# Patient Record
Sex: Female | Born: 1975
Health system: Southern US, Community
[De-identification: ages and names within clinical notes are randomized; demographics above are authoritative.]

## PROBLEM LIST (undated history)

## (undated) DIAGNOSIS — F419 Anxiety disorder, unspecified: Secondary | ICD-10-CM

## (undated) DIAGNOSIS — F410 Panic disorder [episodic paroxysmal anxiety] without agoraphobia: Secondary | ICD-10-CM

## (undated) DIAGNOSIS — E039 Hypothyroidism, unspecified: Secondary | ICD-10-CM

## (undated) DIAGNOSIS — R002 Palpitations: Secondary | ICD-10-CM

## (undated) HISTORY — DX: Anxiety disorder, unspecified: F41.9

## (undated) HISTORY — DX: Hypothyroidism, unspecified: E03.9

## (undated) HISTORY — DX: Panic disorder (episodic paroxysmal anxiety): F41.0

---
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
## 2002-08-03 HISTORY — PX: CHOLECYSTECTOMY: SHX55

## 2006-05-17 ENCOUNTER — Inpatient Hospital Stay: Payer: Self-pay | Admitting: Certified Nurse Midwife

## 2011-06-01 ENCOUNTER — Ambulatory Visit: Payer: Self-pay | Admitting: Family Medicine

## 2012-08-03 HISTORY — PX: LAPAROSCOPIC GASTRIC SLEEVE RESECTION: SHX5895

## 2013-04-26 ENCOUNTER — Ambulatory Visit: Payer: Self-pay | Admitting: Specialist

## 2013-04-26 DIAGNOSIS — E7800 Pure hypercholesterolemia, unspecified: Secondary | ICD-10-CM

## 2013-05-18 ENCOUNTER — Other Ambulatory Visit: Payer: Self-pay | Admitting: Specialist

## 2013-05-18 DIAGNOSIS — J029 Acute pharyngitis, unspecified: Secondary | ICD-10-CM

## 2013-05-26 ENCOUNTER — Ambulatory Visit
Admission: RE | Admit: 2013-05-26 | Discharge: 2013-05-26 | Disposition: A | Discharge status: Home / Self Care | Payer: 59 | Source: Ambulatory Visit | Attending: Specialist | Admitting: Specialist

## 2013-05-26 DIAGNOSIS — J029 Acute pharyngitis, unspecified: Secondary | ICD-10-CM

## 2013-06-02 ENCOUNTER — Ambulatory Visit: Payer: Self-pay | Admitting: Specialist

## 2013-06-03 ENCOUNTER — Ambulatory Visit: Payer: Self-pay | Admitting: Specialist

## 2013-07-17 ENCOUNTER — Ambulatory Visit: Payer: Self-pay | Admitting: Specialist

## 2015-04-09 ENCOUNTER — Ambulatory Visit (INDEPENDENT_AMBULATORY_CARE_PROVIDER_SITE_OTHER): Payer: 59 | Admitting: Family Medicine

## 2015-04-09 ENCOUNTER — Encounter: Payer: Self-pay | Admitting: Family Medicine

## 2015-04-09 VITALS — BP 116/70 | HR 84 | Temp 98.0°F | Resp 16 | Ht 62.0 in | Wt 155.2 lb

## 2015-04-09 DIAGNOSIS — F418 Other specified anxiety disorders: Secondary | ICD-10-CM

## 2015-04-09 DIAGNOSIS — Z1231 Encounter for screening mammogram for malignant neoplasm of breast: Secondary | ICD-10-CM | POA: Insufficient documentation

## 2015-04-09 DIAGNOSIS — R002 Palpitations: Secondary | ICD-10-CM

## 2015-04-09 DIAGNOSIS — E039 Hypothyroidism, unspecified: Secondary | ICD-10-CM | POA: Diagnosis not present

## 2015-04-09 DIAGNOSIS — F3341 Major depressive disorder, recurrent, in partial remission: Secondary | ICD-10-CM | POA: Insufficient documentation

## 2015-04-09 DIAGNOSIS — Z136 Encounter for screening for cardiovascular disorders: Secondary | ICD-10-CM

## 2015-04-09 DIAGNOSIS — IMO0001 Reserved for inherently not codable concepts without codable children: Secondary | ICD-10-CM | POA: Insufficient documentation

## 2015-04-09 DIAGNOSIS — Z13220 Encounter for screening for lipoid disorders: Secondary | ICD-10-CM

## 2015-04-09 HISTORY — DX: Hypothyroidism, unspecified: E03.9

## 2015-04-09 HISTORY — DX: Palpitations: R00.2

## 2015-04-09 MED ORDER — ALPRAZOLAM 0.5 MG PO TABS: 0.5000 mg | ORAL_TABLET | Freq: Every day | ORAL | Status: DC | PRN

## 2015-04-09 MED ORDER — SERTRALINE HCL 50 MG PO TABS: 50.0000 mg | ORAL_TABLET | Freq: Every day | ORAL | Status: DC

## 2015-04-09 NOTE — Progress Notes (Signed)
Name: Sheri Garrison   MRN: [id number]    DOB: [birth year]   Date:04/09/2015       Progress Note  Subjective  Chief Complaint  Chief Complaint  Patient presents with  . Establish Care  . Labs Only    patient needs to have her thyroid check, has already been diagnosed.  Marland Kitchen Referral    mammogram  . Anxiety    HPI  Sheri Garrison is a 39 year old female who is here today to establish care for primary care services and discuss ongoing medical needs.  She would like blood work done and an order for her annual mammogram. PAP testing done 09/03/2013.   Anxiety: Patient complains of anxiety disorder, panic attacks and sleep disturbance.  She has the following symptoms: chest pain, difficulty concentrating, fatigue, feelings of losing control, insomnia, palpitations, racing thoughts. Onset of symptoms was approximately several years ago, stable since that time. She denies current suicidal and homicidal ideation. Family history significant for anxiety, depression and heart disease. Possible organic causes contributing are: endocrine/metabolic. Risk factors: previous episode of depression. Previous treatment includes Xanax and Zoloft.  She complains of the following side effects from the treatment: none.  Palpitations: Patient complains of palpitations and rapid heart beat.  The symptoms are of off and on and intermittent severity, occuring intermittently and in the evening and lasting several years per episode. Cardiac risk factors include: family history of premature cardiovascular disease. Aggravating factors: stress/anxiety, thyroid medication. Relieving factors: spontaneous. Associated signs and symptoms: has no complaint(s) of claudication, dyspnea, exertional chest pressure/discomfort, lower extremity edema, near-syncope and syncope.  Hypothyroidism: Patient presents for evaluation of thyroid function. Symptoms consist of fatigue, anxiousness, palpitations. Symptoms have present for several years. The  symptoms are off and on and intermittent.  The problem has been stable.  Previous thyroid studies include TSH, free thyroxine index and free thyroxine. The hypothyroidism is due to hypothyroidism and Hashimoto's disease.  No family history of thyroid cancer.      Patient Active Problem List   Diagnosis Date Noted  . Hypothyroidism, adult 04/09/2015  . Depression with anxiety 04/09/2015    Social History  Substance Use Topics  . Smoking status: Never Smoker   . Smokeless tobacco: Not on file  . Alcohol Use: 0.0 oz/week    0 Standard drinks or equivalent per week     Comment: occasionally     Current outpatient prescriptions:  .  ALPRAZolam (XANAX) 0.5 MG tablet, Take 0.5 mg by mouth daily as needed., Disp: , Rfl: 0 .  levothyroxine (SYNTHROID, LEVOTHROID) 175 MCG tablet, TAKE 1 TABLET (175 MCG) BY ORAL ROUTE ONCE DAILY FOR 30 DAYS, Disp: , Rfl: 0 .  sertraline (ZOLOFT) 25 MG tablet, Take 50 mg by mouth daily., Disp: , Rfl: 3  Past Surgical History  Procedure Laterality Date  . Cholecystectomy  2004  . Laparoscopic gastric sleeve resection  2014    Family History  Problem Relation Age of Onset  . Cancer Mother     breast  . Heart disease Mother   . Hyperlipidemia Mother   . Hypertension Mother   . Hearing loss Father     due to age  . Depression Maternal Aunt   . Hyperlipidemia Maternal Aunt   . Hypertension Maternal Aunt   . Arthritis Maternal Grandmother   . Hyperlipidemia Maternal Grandmother   . Hypertension Maternal Grandmother   . Heart disease Maternal Grandfather   . Hyperlipidemia Maternal Grandfather   . Hypertension Maternal  Grandfather   . Stroke Paternal Grandmother   . Heart disease Paternal Grandfather     Allergies  Allergen Reactions  . Alka-Seltzer Plus Cold [Chlorphen-Phenyleph-Asa] Swelling  . Tessalon [Benzonatate] Hives and Swelling     Review of Systems  CONSTITUTIONAL: No significant weight changes, fever, chills, weakness or  fatigue.  HEENT:  - Eyes: No visual changes.  - Ears: No auditory changes. No pain.  - Nose: No sneezing, congestion, runny nose. - Throat: No sore throat. No changes in swallowing. SKIN: No rash or itching.  CARDIOVASCULAR: No chest pain, chest pressure or chest discomfort. No palpitations or edema.  RESPIRATORY: No shortness of breath, cough or sputum.  GASTROINTESTINAL: No anorexia, nausea, vomiting. No changes in bowel habits. No abdominal pain or blood.  GENITOURINARY: No dysuria. No frequency. No discharge.  NEUROLOGICAL: No headache, dizziness, syncope, paralysis, ataxia, numbness or tingling in the extremities. No memory changes. No change in bowel or bladder control.  MUSCULOSKELETAL: No joint pain. No muscle pain. HEMATOLOGIC: No anemia, bleeding or bruising.  LYMPHATICS: No enlarged lymph nodes.  PSYCHIATRIC: No change in mood. No change in sleep pattern.  ENDOCRINOLOGIC: No reports of sweating, cold or heat intolerance. No polyuria or polydipsia.     Objective  BP 116/70 mmHg  Pulse 84  Temp(Src) 98 F (36.7 C) (Oral)  Resp 16  Ht  (1.575 m)  Wt 155 lb 3.2 oz (70.398 kg)  BMI 28.38 kg/m2  SpO2 98%  LMP 04/02/2015 (Exact Date) Body mass index is 28.38 kg/(m^2).  Physical Exam  Constitutional: Patient appears well-developed and well-nourished. In no distress.  HEENT:  - Head: Normocephalic and atraumatic.  - Ears: Bilateral TMs gray, no erythema or effusion - Nose: Nasal mucosa moist - Mouth/Throat: Oropharynx is clear and moist. No tonsillar hypertrophy or erythema. No post nasal drainage.  - Eyes: Conjunctivae clear, EOM movements normal. PERRLA. No scleral icterus.  Neck: Normal range of motion. Neck supple. No JVD present. No thyromegaly present.  Cardiovascular: Normal rate, regular rhythm and normal heart sounds.  No murmur heard.  Pulmonary/Chest: Effort normal and breath sounds normal. No respiratory distress. Musculoskeletal: Normal range of  motion bilateral UE and LE, no joint effusions. Peripheral vascular: Bilateral LE no edema. Neurological: CN II-XII grossly intact with no focal deficits. Alert and oriented to person, place, and time. Coordination, balance, strength, speech and gait are normal.  Skin: Skin is warm and dry. No rash noted. No erythema.  Psychiatric: Patient has a stable mood and affect. Behavior is normal in office today. Judgment and thought content normal in office today.   Assessment & Plan  1. Hypothyroidism, adult Lab work. Has previously been on Synthroid brand due to fluctuations in thyroid control.  - TSH - T3, free - T4, free  2. Depression with anxiety Clinically stable findings based on clinical exam and on review of any pertinent results. Recommended to patient that they continue their current regimen with regular follow ups.  - ALPRAZolam (XANAX) 0.5 MG tablet; Take 1 tablet (0.5 mg total) by mouth daily as needed.  Dispense: 30 tablet; Refill: 5 - sertraline (ZOLOFT) 50 MG tablet; Take 1 tablet (50 mg total) by mouth daily.  Dispense: 30 tablet; Refill: 5  3. Encounter for cholesteral screening for cardiovascular disease  - Lipid panel  4. Encounter for screening mammogram for malignant neoplasm of breast  - MM Digital Screening; Future  5. Heart palpitations Most likely related to anxiety. Will get EKG during CPE.  -  CBC with Differential/Platelet - Comprehensive metabolic panel - Magnesium

## 2015-04-15 LAB — CBC WITH DIFFERENTIAL/PLATELET
Basophils Absolute: 0.1 10*3/uL (ref 0.0–0.2)
Basos: 1 %
EOS (ABSOLUTE): 0.3 10*3/uL (ref 0.0–0.4)
Eos: 5 %
Hematocrit: 33.9 % — ABNORMAL LOW (ref 34.0–46.6)
Hemoglobin: 11.2 g/dL (ref 11.1–15.9)
Immature Grans (Abs): 0 10*3/uL (ref 0.0–0.1)
Immature Granulocytes: 0 %
LYMPHS ABS: 2.2 10*3/uL (ref 0.7–3.1)
Lymphs: 39 %
MCH: 27.8 pg (ref 26.6–33.0)
MCHC: 33 g/dL (ref 31.5–35.7)
MCV: 84 fL (ref 79–97)
MONOS ABS: 0.5 10*3/uL (ref 0.1–0.9)
Monocytes: 8 %
Neutrophils Absolute: 2.6 10*3/uL (ref 1.4–7.0)
Neutrophils: 47 %
PLATELETS: 356 10*3/uL (ref 150–379)
RBC: 4.03 x10E6/uL (ref 3.77–5.28)
RDW: 14.5 % (ref 12.3–15.4)
WBC: 5.6 10*3/uL (ref 3.4–10.8)

## 2015-04-16 LAB — LIPID PANEL
Chol/HDL Ratio: 2.7 ratio units (ref 0.0–4.4)
Cholesterol, Total: 173 mg/dL (ref 100–199)
HDL: 65 mg/dL (ref >39)
LDL Calculated: 90 mg/dL (ref 0–99)
TRIGLYCERIDES: 88 mg/dL (ref 0–149)
VLDL CHOLESTEROL CAL: 18 mg/dL (ref 5–40)

## 2015-04-16 LAB — COMPREHENSIVE METABOLIC PANEL
ALBUMIN: 4.1 g/dL (ref 3.5–5.5)
ALT: 16 IU/L (ref 0–32)
AST: 17 IU/L (ref 0–40)
Albumin/Globulin Ratio: 1.6 (ref 1.1–2.5)
Alkaline Phosphatase: 59 IU/L (ref 39–117)
BUN/Creatinine Ratio: 13 (ref 9–23)
BUN: 10 mg/dL (ref 6–24)
Bilirubin Total: 0.7 mg/dL (ref 0.0–1.2)
CALCIUM: 9.6 mg/dL (ref 8.7–10.2)
CO2: 25 mmol/L (ref 18–29)
CREATININE: 0.76 mg/dL (ref 0.57–1.00)
Chloride: 99 mmol/L (ref 97–108)
GFR calc Af Amer: 113 mL/min/{1.73_m2} (ref >59)
GFR, EST NON AFRICAN AMERICAN: 98 mL/min/{1.73_m2} (ref >59)
GLOBULIN, TOTAL: 2.5 g/dL (ref 1.5–4.5)
Glucose: 81 mg/dL (ref 65–99)
Potassium: 5.1 mmol/L (ref 3.5–5.2)
SODIUM: 136 mmol/L (ref 134–144)
Total Protein: 6.6 g/dL (ref 6.0–8.5)

## 2015-04-16 LAB — T3, FREE: T3, Free: 2.7 pg/mL (ref 2.0–4.4)

## 2015-04-16 LAB — T4, FREE: Free T4: 1.68 ng/dL (ref 0.82–1.77)

## 2015-04-16 LAB — TSH: TSH: 0.039 u[IU]/mL — AB (ref 0.450–4.500)

## 2015-04-16 LAB — MAGNESIUM: MAGNESIUM: 2 mg/dL (ref 1.6–2.3)

## 2015-06-26 ENCOUNTER — Encounter: Payer: Self-pay | Admitting: Family Medicine

## 2015-07-01 ENCOUNTER — Other Ambulatory Visit: Payer: Self-pay | Admitting: Family Medicine

## 2015-07-01 MED ORDER — LEVOTHYROXINE SODIUM 175 MCG PO TABS: 175.0000 ug | ORAL_TABLET | Freq: Every day | ORAL | Status: DC

## 2015-07-17 ENCOUNTER — Other Ambulatory Visit: Payer: Self-pay

## 2015-07-17 DIAGNOSIS — F418 Other specified anxiety disorders: Secondary | ICD-10-CM

## 2015-07-17 MED ORDER — SERTRALINE HCL 50 MG PO TABS: 50.0000 mg | ORAL_TABLET | Freq: Every day | ORAL | Status: DC

## 2015-07-17 NOTE — Telephone Encounter (Signed)
Got a fax from [facility] requesting a 90 day supply of this patient's Zoloft.  Refill request was sent to Dr. Edwena FeltyAshany Sundaram for approval and submission.

## 2015-07-22 ENCOUNTER — Other Ambulatory Visit: Payer: Self-pay

## 2015-07-22 DIAGNOSIS — F418 Other specified anxiety disorders: Secondary | ICD-10-CM

## 2015-07-22 NOTE — Telephone Encounter (Signed)
Got a fax from [facility] requesting a refill of this patient's Zoloft.  Refill request was sent to Dr. Edwena FeltyAshany Sundaram for approval and submission.

## 2015-07-23 MED ORDER — SERTRALINE HCL 50 MG PO TABS: 50.0000 mg | ORAL_TABLET | Freq: Every day | ORAL | Status: DC

## 2015-07-30 ENCOUNTER — Encounter: Payer: Self-pay | Admitting: Family Medicine

## 2015-07-30 ENCOUNTER — Ambulatory Visit (INDEPENDENT_AMBULATORY_CARE_PROVIDER_SITE_OTHER): Payer: 59 | Admitting: Family Medicine

## 2015-07-30 VITALS — BP 116/72 | HR 72 | Temp 98.0°F | Resp 12 | Wt 157.8 lb

## 2015-07-30 DIAGNOSIS — L989 Disorder of the skin and subcutaneous tissue, unspecified: Secondary | ICD-10-CM | POA: Insufficient documentation

## 2015-07-30 DIAGNOSIS — F3341 Major depressive disorder, recurrent, in partial remission: Secondary | ICD-10-CM

## 2015-07-30 DIAGNOSIS — E039 Hypothyroidism, unspecified: Secondary | ICD-10-CM

## 2015-07-30 DIAGNOSIS — F418 Other specified anxiety disorders: Secondary | ICD-10-CM

## 2015-07-30 DIAGNOSIS — Z0000 Encounter for general adult medical examination without abnormal findings: Secondary | ICD-10-CM

## 2015-07-30 MED ORDER — BUPROPION HCL ER (SR) 150 MG PO TB12: 150.0000 mg | ORAL_TABLET | Freq: Two times a day (BID) | ORAL | Status: DC

## 2015-07-30 NOTE — Progress Notes (Signed)
Name: Sheri Garrison   MRN: [id number]    DOB: [birth year]   Date:07/30/2015       Progress Note  Subjective  Chief Complaint  Chief Complaint  Patient presents with  . Annual Exam    patient has a questionable mole on her the left side of her back near bra strap.  Marland Kitchen. Referral  . Anxiety    patient stated that she feels that her anxiety has increased  . Fatigue    patient has had decreased energy    HPI  Patient is here today for a Complete Female Physical Exam:  The patient has several complaints. Overall feels health needs are stable. Diet is well balanced. In general does not exercise regularly. Sees dentist regularly and addresses vision concerns with ophthalmologist if applicable. In regards to sexual activity the patient is currently sexually active. Currently is not concerned about exposure to any STDs. Does report a pruritic skin lesion left upper back near shoulder blade. Onset months ago. Has not seen Dermatology in awhile.   Anxiety: Patient complains of snappy moods, anxiety disorder, panic attacks and sleep disturbance. She has the following symptoms: chest pain, difficulty concentrating, fatigue, feelings of losing control, insomnia, palpitations, racing thoughts. Onset of symptoms was approximately several years ago, stable since that time. She denies current suicidal and homicidal ideation. Family history significant for anxiety, depression and heart disease. Possible organic causes contributing are: endocrine/metabolic. Risk factors: previous episode of depression. Previous treatment includes Xanax and Zoloft. She complains of the following side effects from the treatment: none. Has been on Wellbutrin in the past as well.   Hypothyroidism: Patient presents for evaluation of thyroid function. Symptoms consist of fatigue, anxiousness, palpitations. Symptoms have present for several years. The symptoms are off and on and intermittent. The problem has been stable. Previous  thyroid studies include TSH, free thyroxine index and free thyroxine. The hypothyroidism is due to hypothyroidism and Hashimoto's disease. No family history of thyroid cancer. We previously tried to contact patient regarding lab results in 04/2015 but she did not return our calls. Her TSH was suppressed suggesting too much levothyroxine dose but today she is reporting fatigue symptoms which may be related to mood disorder vs hypothyroidism.   Past Medical History  Diagnosis Date  . Anxiety     Past Surgical History  Procedure Laterality Date  . Cholecystectomy  2004  . Laparoscopic gastric sleeve resection  2014    Family History  Problem Relation Age of Onset  . Cancer Mother     breast  . Heart disease Mother   . Hyperlipidemia Mother   . Hypertension Mother   . Hearing loss Father     due to age  . Depression Maternal Aunt   . Hyperlipidemia Maternal Aunt   . Hypertension Maternal Aunt   . Arthritis Maternal Grandmother   . Hyperlipidemia Maternal Grandmother   . Hypertension Maternal Grandmother   . Heart disease Maternal Grandfather   . Hyperlipidemia Maternal Grandfather   . Hypertension Maternal Grandfather   . Stroke Paternal Grandmother   . Heart disease Paternal Grandfather     Social History   Social History  . Marital Status: Married    Spouse Name: N/A  . Number of Children: N/A  . Years of Education: N/A   Occupational History  . Not on file.   Social History Main Topics  . Smoking status: Never Smoker   . Smokeless tobacco: Not on file  . Alcohol Use: 0.0 oz/week  0 Standard drinks or equivalent per week     Comment: occasionally  . Drug Use: No  . Sexual Activity:    Partners: Male    Birth Control/ Protection: None   Other Topics Concern  . Not on file   Social History Narrative     Current outpatient prescriptions:  .  ALPRAZolam (XANAX) 0.5 MG tablet, Take 1 tablet (0.5 mg total) by mouth daily as needed., Disp: 30 tablet, Rfl:  5 .  levothyroxine (SYNTHROID, LEVOTHROID) 175 MCG tablet, Take 1 tablet (175 mcg total) by mouth daily before breakfast., Disp: 30 tablet, Rfl: 0 .  sertraline (ZOLOFT) 50 MG tablet, Take 1 tablet (50 mg total) by mouth daily., Disp: 90 tablet, Rfl: 1  Allergies  Allergen Reactions  . Alka-Seltzer Plus Cold [Chlorphen-Phenyleph-Asa] Swelling  . Tessalon [Benzonatate] Hives and Swelling    ROS  CONSTITUTIONAL: No significant weight changes, fever, chills, weakness. Yes fatigue.  HEENT:  - Eyes: No visual changes.  - Ears: No auditory changes. No pain.  - Nose: No sneezing, congestion, runny nose. - Throat: No sore throat. No changes in swallowing. SKIN:Yes rash and itching.  CARDIOVASCULAR: No chest pain, chest pressure or chest discomfort. No palpitations or edema.  RESPIRATORY: No shortness of breath, cough or sputum.  GASTROINTESTINAL: No anorexia, nausea, vomiting. No changes in bowel habits. No abdominal pain or blood.  GENITOURINARY: No dysuria. No frequency. No discharge.  NEUROLOGICAL: No headache, dizziness, syncope, paralysis, ataxia, numbness or tingling in the extremities. No memory changes. No change in bowel or bladder control.  MUSCULOSKELETAL: No joint pain. No muscle pain. HEMATOLOGIC: No anemia, bleeding or bruising.  LYMPHATICS: No enlarged lymph nodes.  PSYCHIATRIC: Slight change in mood. No change in sleep pattern.  ENDOCRINOLOGIC: No reports of sweating, cold or heat intolerance. No polyuria or polydipsia.   Objective  Filed Vitals:   07/30/15 1150  BP: 116/72  Pulse: 72  Temp: 98 F (36.7 C)  TempSrc: Oral  Resp: 12  Weight: 157 lb 12.8 oz (71.578 kg)  SpO2: 98%   Body mass index is 28.85 kg/(m^2).  Depression screen [facility] 2/9 07/30/2015 04/09/2015  Decreased Interest 0 0  Down, Depressed, Hopeless 0 0  PHQ - 2 Score 0 0     Physical Exam  Constitutional: Patient appears well-developed and well-nourished. In no distress.  HEENT:  - Head:  Normocephalic and atraumatic.  - Ears: Bilateral TMs gray, no erythema or effusion - Nose: Nasal mucosa moist - Mouth/Throat: Oropharynx is clear and moist. No tonsillar hypertrophy or erythema. No post nasal drainage.  - Eyes: Conjunctivae clear, EOM movements normal. PERRLA. No scleral icterus.  Neck: Normal range of motion. Neck supple. No JVD present. No thyromegaly present.  Cardiovascular: Normal rate, regular rhythm and normal heart sounds.  No murmur heard.  Pulmonary/Chest: Effort normal and breath sounds normal. No respiratory distress. Abdominal: Soft. Bowel sounds are normal, no distension. There is no tenderness. no masses BREAST: Bilateral breast exam normal with no masses, skin changes or nipple discharge.  FEMALE GENITALIA: Deferred  Musculoskeletal: Normal range of motion bilateral UE and LE, no joint effusions. Peripheral vascular: Bilateral LE no edema. Neurological: CN II-XII grossly intact with no focal deficits. Alert and oriented to person, place, and time. Coordination, balance, strength, speech and gait are normal.  Skin: Skin is warm and dry. Left shoulder blade overlying skin with raised scaly lesion with surround excoriations. Scattered nevus on body.  Psychiatric: Patient has a stable mood and affect. Behavior  is normal in office today. Judgment and thought content normal in office today.   Assessment & Plan  1. Annual physical exam Discussed in detail all recommended preventative measures appropriate for age and gender now and in the future. Mammogram ordered 04/2015 patient just needs to call and schedule.    2. Hypothyroidism, adult Recheck thyroid lab. If levothyroxine dose needs to be adjusted will call patient.  - T3, free - T4, free - TSH  3. Major depressive disorder, recurrent episode, in partial remission with anxious distress (HCC) Reasonably well controled however having more low energy, will add on Wellbutrin SR. The patient has been counseled  on the proper use, side effects and potential interactions of the new medication. Patient encouraged to review the side effects and safety profile pamphlet provided with the prescription from the pharmacy as well as request counseling from the pharmacy team as needed.   - buPROPion (WELLBUTRIN SR) 150 MG 12 hr tablet; Take 1 tablet (150 mg total) by mouth 2 (two) times daily.  Dispense: 60 tablet; Refill: 1  4. Skin lesion of back Evaluation by Derm for possible biopsy.  - Ambulatory referral to Dermatology

## 2015-08-02 ENCOUNTER — Other Ambulatory Visit: Payer: Self-pay | Admitting: Family Medicine

## 2015-09-18 ENCOUNTER — Ambulatory Visit
Admission: RE | Admit: 2015-09-18 | Discharge: 2015-09-18 | Disposition: A | Discharge status: Home / Self Care | Payer: 59 | Source: Ambulatory Visit | Attending: Family Medicine | Admitting: Family Medicine

## 2015-09-18 DIAGNOSIS — Z1231 Encounter for screening mammogram for malignant neoplasm of breast: Secondary | ICD-10-CM | POA: Diagnosis present

## 2015-09-24 ENCOUNTER — Other Ambulatory Visit: Payer: Self-pay | Admitting: Family Medicine

## 2015-09-25 ENCOUNTER — Other Ambulatory Visit: Payer: Self-pay | Admitting: Family Medicine

## 2015-09-25 DIAGNOSIS — R921 Mammographic calcification found on diagnostic imaging of breast: Secondary | ICD-10-CM

## 2015-10-08 ENCOUNTER — Other Ambulatory Visit: Payer: Self-pay | Admitting: Family Medicine

## 2015-10-08 ENCOUNTER — Encounter: Payer: Self-pay | Admitting: Family Medicine

## 2015-10-08 LAB — T3, FREE: T3 FREE: 2.3 pg/mL (ref 2.0–4.4)

## 2015-10-08 LAB — TSH: TSH: 21.93 u[IU]/mL — ABNORMAL HIGH (ref 0.450–4.500)

## 2015-10-08 LAB — T4, FREE: Free T4: 0.9 ng/dL (ref 0.82–1.77)

## 2015-10-10 ENCOUNTER — Telehealth: Payer: Self-pay | Admitting: Family Medicine

## 2015-10-10 DIAGNOSIS — E039 Hypothyroidism, unspecified: Secondary | ICD-10-CM

## 2015-10-10 MED ORDER — LEVOTHYROXINE SODIUM 175 MCG PO TABS: 175.0000 ug | ORAL_TABLET | Freq: Every day | ORAL | Status: DC

## 2015-10-10 NOTE — Telephone Encounter (Signed)
-----   Message from Marcos EkeJamie Graves, CMA sent at 10/10/2015 10:43 AM EST ----- Patient is currently not on anything last RX was for 175mcg

## 2015-10-10 NOTE — Telephone Encounter (Signed)
Sheri MuirJamie I have sent Sheri Garrison a MyChart message but I want you to speak with her too:  Please clarify to her that once a patient has Hypothyroidism they will always need to be on levothyroxine. If not they can feel very off, fatigued, poor focus, hair loss, constipation are some of the many symptoms if the thyroid hormones are not balanced. She should always be on your Levothyroxine 175 mcg take one a day in the mornings on an empty stomach.    I have sent refills to her pharmacy. Please have her thyroid labs checked again when she follows up with the new provider to make sure the medication is working and is at the right dose.

## 2015-10-11 ENCOUNTER — Ambulatory Visit
Admission: RE | Admit: 2015-10-11 | Discharge: 2015-10-11 | Disposition: A | Discharge status: Home / Self Care | Payer: 59 | Source: Ambulatory Visit | Attending: Family Medicine | Admitting: Family Medicine

## 2015-10-11 DIAGNOSIS — R921 Mammographic calcification found on diagnostic imaging of breast: Secondary | ICD-10-CM | POA: Insufficient documentation

## 2015-10-17 ENCOUNTER — Other Ambulatory Visit: Payer: Self-pay | Admitting: Family Medicine

## 2015-10-17 ENCOUNTER — Encounter: Payer: Self-pay | Admitting: Family Medicine

## 2015-12-20 ENCOUNTER — Other Ambulatory Visit: Payer: Self-pay

## 2015-12-21 NOTE — Telephone Encounter (Signed)
Please ask patient to schedule an appointment if she wishes to continue the benzodiazepine; her last visit was more than 3 months ago; I look forward to meeting her

## 2015-12-23 NOTE — Telephone Encounter (Signed)
Left voice mail

## 2015-12-23 NOTE — Telephone Encounter (Signed)
Left voice message for patient to return call. °

## 2015-12-24 ENCOUNTER — Telehealth: Payer: Self-pay | Admitting: Family Medicine

## 2015-12-24 MED ORDER — ALPRAZOLAM 0.5 MG PO TABS: 0.5000 mg | ORAL_TABLET | Freq: Every day | ORAL | Status: DC | PRN

## 2015-12-24 NOTE — Telephone Encounter (Signed)
Limited Rx approved after reviewing [facility]

## 2015-12-24 NOTE — Telephone Encounter (Signed)
Patient is scheduled to see Dr. Sherie DonLada on Fri. 01/03/16 @ 9:40am.  Patient would like to know if she could get enough medication of the alprazolam until that appointment.

## 2015-12-25 NOTE — Telephone Encounter (Signed)
Appointment scheduled for 01-03-16

## 2016-01-03 ENCOUNTER — Ambulatory Visit (INDEPENDENT_AMBULATORY_CARE_PROVIDER_SITE_OTHER): Payer: 59 | Admitting: Family Medicine

## 2016-01-03 ENCOUNTER — Encounter: Payer: Self-pay | Admitting: Family Medicine

## 2016-01-03 VITALS — BP 112/72 | HR 99 | Temp 98.1°F | Resp 14 | Wt 150.0 lb

## 2016-01-03 DIAGNOSIS — F3341 Major depressive disorder, recurrent, in partial remission: Secondary | ICD-10-CM

## 2016-01-03 DIAGNOSIS — N898 Other specified noninflammatory disorders of vagina: Secondary | ICD-10-CM | POA: Diagnosis not present

## 2016-01-03 DIAGNOSIS — K130 Diseases of lips: Secondary | ICD-10-CM | POA: Diagnosis not present

## 2016-01-03 DIAGNOSIS — E039 Hypothyroidism, unspecified: Secondary | ICD-10-CM | POA: Diagnosis not present

## 2016-01-03 DIAGNOSIS — R300 Dysuria: Secondary | ICD-10-CM | POA: Diagnosis not present

## 2016-01-03 DIAGNOSIS — L853 Xerosis cutis: Secondary | ICD-10-CM | POA: Diagnosis not present

## 2016-01-03 DIAGNOSIS — R718 Other abnormality of red blood cells: Secondary | ICD-10-CM

## 2016-01-03 DIAGNOSIS — N760 Acute vaginitis: Secondary | ICD-10-CM

## 2016-01-03 DIAGNOSIS — B9689 Other specified bacterial agents as the cause of diseases classified elsewhere: Secondary | ICD-10-CM

## 2016-01-03 DIAGNOSIS — Z79899 Other long term (current) drug therapy: Secondary | ICD-10-CM | POA: Diagnosis not present

## 2016-01-03 DIAGNOSIS — F419 Anxiety disorder, unspecified: Secondary | ICD-10-CM

## 2016-01-03 DIAGNOSIS — F410 Panic disorder [episodic paroxysmal anxiety] without agoraphobia: Secondary | ICD-10-CM | POA: Diagnosis not present

## 2016-01-03 DIAGNOSIS — A499 Bacterial infection, unspecified: Secondary | ICD-10-CM

## 2016-01-03 DIAGNOSIS — F418 Other specified anxiety disorders: Secondary | ICD-10-CM

## 2016-01-03 HISTORY — DX: Panic disorder (episodic paroxysmal anxiety): F41.0

## 2016-01-03 HISTORY — DX: Anxiety disorder, unspecified: F41.9

## 2016-01-03 MED ORDER — ALPRAZOLAM 0.5 MG PO TABS: 0.2500 mg | ORAL_TABLET | Freq: Every day | ORAL | Status: DC | PRN

## 2016-01-03 MED ORDER — METRONIDAZOLE 500 MG PO TABS: 500.0000 mg | ORAL_TABLET | Freq: Two times a day (BID) | ORAL | Status: AC

## 2016-01-03 NOTE — Assessment & Plan Note (Signed)
Check labs today; encouraged compliance

## 2016-01-03 NOTE — Patient Instructions (Addendum)
Start the new antibiotic for BV (not sexually transmitted) Start to use less and less of the Xanax as you are able I really encourage you to start working with a therapist regarding anxiety Have labs done today Return in one month to follow-up on anxiety and see if medicine adjustment needed

## 2016-01-03 NOTE — Progress Notes (Signed)
BP 112/72 mmHg  Pulse 99  Temp(Src) 98.1 F (36.7 C) (Oral)  Resp 14  Wt 150 lb (68.04 kg)  SpO2 98%  LMP 12/13/2015 (Approximate)   Subjective:    Patient ID: Sheri Garrison, female    DOB: 1976/03/22, 40 y.o.   MRN: [id number]  HPI: Sheri Garrison is a 40 y.o. female  Chief Complaint  Patient presents with  . Medication Refill  . chapped lips    6 months has tried lots of chapstick with no relief  . Vaginal Discharge    irritation went to the beach last weekend.  yellowish discharge with itching   Patient is new to me; her previous provider left our practice  Chapped lips; going on for 6 months; didn't think much of it in the winter, but still going on; not any better Dry skin, but uses lotion Lips have cracked and peeled; mostly bottom lip; corners of mouth  Thyroid has way out of range; skyrockets and comes back down Lab Results  Component Value Date   TSH 21.930* 10/07/2015  Previous was 0.039; she has been on 175 mcg for a while; some compliance issues, she admits; went several weeks without being on anything, was really high, then went back on 175 and not checked again Never has enough energy; BMs normal; weight stable  She went to the beach this past weekend, irritated and itchy down there; doesn't think it's a yeast infection; more yellow; no new sexual partners, no unprotected sex; no sharing of swimsuits; no sauna or hottub, did get in the pool; no white discharge; a little burning with urination  She needs a refill of xanax; she has a problem with anxiety; mainly in the evenings; mind starts racing; starts a cycle, racing thoughts, can't sleep, takes it to relax at night; she has not worked with counselor; rarely drinks alcohol; knows not to mix with xanax; does not take medicine just for effect; only takes when uneasy and chest bothers her, anxiety; anxiety runs in the family; mother's side  Depression screen [facility] 2/9 01/03/2016 07/30/2015 04/09/2015  Decreased  Interest 0 0 0  Down, Depressed, Hopeless 0 0 0  PHQ - 2 Score 0 0 0   Relevant past medical, surgical, family and social history reviewed Past Medical History  Diagnosis Date  . Anxiety   . Chronic anxiety 01/03/2016  . Panic disorder without agoraphobia 01/03/2016   Past Surgical History  Procedure Laterality Date  . Cholecystectomy  2004  . Laparoscopic gastric sleeve resection  2014   Family History  Problem Relation Age of Onset  . Heart disease Mother   . Hyperlipidemia Mother   . Hypertension Mother   . Breast cancer Mother 46  . Hearing loss Father     due to age  . Depression Maternal Aunt   . Hyperlipidemia Maternal Aunt   . Hypertension Maternal Aunt   . Breast cancer Maternal Aunt     50's  . Arthritis Maternal Grandmother   . Hyperlipidemia Maternal Grandmother   . Hypertension Maternal Grandmother   . Heart disease Maternal Grandfather   . Hyperlipidemia Maternal Grandfather   . Hypertension Maternal Grandfather   . Stroke Paternal Grandmother   . Heart disease Paternal Grandfather    Social History  Substance Use Topics  . Smoking status: Never Smoker   . Smokeless tobacco: None  . Alcohol Use: 0.0 oz/week    0 Standard drinks or equivalent per week     Comment:  occasionally  she says she does NOT drink and use benzo together  Interim medical history since last visit reviewed. Allergies and medications reviewed  Review of Systems Per HPI unless specifically indicated above     Objective:    BP 112/72 mmHg  Pulse 99  Temp(Src) 98.1 F (36.7 C) (Oral)  Resp 14  Wt 150 lb (68.04 kg)  SpO2 98%  LMP 12/13/2015 (Approximate)  Wt Readings from Last 3 Encounters:  01/03/16 150 lb (68.04 kg)  07/30/15 157 lb 12.8 oz (71.578 kg)  04/09/15 155 lb 3.2 oz (70.398 kg)    Physical Exam  Constitutional: She appears well-developed and well-nourished. No distress.  HENT:  Head: Normocephalic and atraumatic.  Nose: Nose normal.  Mouth/Throat:  Oropharynx is clear and moist. No oropharyngeal exudate.  Eyes: EOM are normal. No scleral icterus.  Neck: No thyromegaly present.  Cardiovascular: Normal rate, regular rhythm and normal heart sounds.   No murmur heard. Pulmonary/Chest: Effort normal and breath sounds normal. No respiratory distress. She has no wheezes.  Abdominal: Soft. Bowel sounds are normal. She exhibits no distension.  Genitourinary: Cervix exhibits no discharge and no friability. No tenderness or bleeding in the vagina. No signs of injury around the vagina. Vaginal discharge (thin whitish discharge, no odor) found.  Musculoskeletal: Normal range of motion. She exhibits no edema.  Neurological: She is alert. She exhibits normal muscle tone.  Skin: Skin is warm and dry. She is not diaphoretic. No pallor.  Mild chapping of lips, lower more than upper; no erythema or vesicles; slight erythema at the corners of the mouth  Psychiatric: She has a normal mood and affect. Her behavior is normal. Judgment and thought content normal. Her mood appears not anxious. Cognition and memory are normal. She does not exhibit a depressed mood.  Good eye contact with examiner   Results for orders placed or performed in visit on 07/30/15  T3, free  Result Value Ref Range   T3, Free 2.3 2.0 - 4.4 pg/mL  T4, free  Result Value Ref Range   Free T4 0.90 0.82 - 1.77 ng/dL  TSH  Result Value Ref Range   TSH 21.930 (H) 0.450 - 4.500 uIU/mL      Assessment & Plan:   Problem List Items Addressed This Visit      Digestive   Chapped lips    With what appears to be mild case of angular chelitis; keep corners of mouth dry, use tiny amount of vagisil NOT where it may be ingested; and we'll check vit D      Relevant Orders   VITAMIN D 25 Hydroxy (Vit-D Deficiency, Fractures)     Endocrine   Hypothyroidism, adult - Primary    Check labs today; encouraged compliance      Relevant Orders   T4, free   TSH   T3, free   Thyroglobulin  antibody   Thyroid Peroxidase Antibody     Other   Chronic anxiety    Previous provider has had numerous patients on Xanax and I simple did not have the time today to challenge this or try to talk her out of it or change it; I did recommend counseling, and to decrease amount some, giving her only 25 pills for one month, and will see her back at f/u and talk then about tapering/switching to SSRI or other means besides just chronic benzos; I did give her my typical speech about benzos, controlled substances in general; I do not plan on continuing  this patient long-term on benzos, but again we simple did not have time today to run though the talk it takes to get someone to agree to come off; I will see her back in one month to discuss benzo use      Relevant Medications   ALPRAZolam (XANAX) 0.5 MG tablet   Controlled substance agreement signed    Discussed risk of controlled substances including possible unintentional overdose, especially if mixed with alcohol or other pills; typical speech given including illegal to share, even out of the goodness of patient's heart, always keep in the original bottle, safeguard medicine, do NOT mix with alcohol, other pain pills, "nerve" or anxiety pills, or sleeping pills; I am not obligated to approve of early refill or give new prescription if medicine is lost, stolen, or destroyed even with a police report, etc.; patient agrees with plan; controlled substance contract signed; copy of contract given to patient      Dry skin   Relevant Orders   VITAMIN D 25 Hydroxy (Vit-D Deficiency, Fractures)   RESOLVED: Major depressive disorder, recurrent episode, in partial remission with anxious distress (HCC)   Relevant Medications   ALPRAZolam (XANAX) 0.5 MG tablet   Panic disorder without agoraphobia    Will see her back on one month to address benzo use      Vaginal discharge   Relevant Orders   GC/chlamydia probe amp, urine    Other Visit Diagnoses    Dysuria         Relevant Orders    UA/M w/rflx Culture, Routine    Microcytosis        Relevant Orders    CBC with Differential/Platelet    Ferritin    Bacterial vaginosis        wet mount done by MD; will start metronidazole    Relevant Medications    metroNIDAZOLE (FLAGYL) 500 MG tablet       Follow up plan: Return in about 1 month (around 02/02/2016) for follow-up anxiety.  An after-visit summary was printed and given to the patient at check-out.  Please see the patient instructions which may contain other information and recommendations beyond what is mentioned above in the assessment and plan.  Meds ordered this encounter  Medications  . ALPRAZolam (XANAX) 0.5 MG tablet    Sig: Take 0.5-1 tablets (0.25-0.5 mg total) by mouth daily as needed.    Dispense:  25 tablet    Refill:  0    [facility] [address] [place]  . metroNIDAZOLE (FLAGYL) 500 MG tablet    Sig: Take 1 tablet (500 mg total) by mouth 2 (two) times daily. No alcohol or cold medicines that contain alcohol while on this med    Dispense:  14 tablet    Refill:  0   Orders Placed This Encounter  Procedures  . T4, free  . TSH  . T3, free  . Thyroglobulin antibody  . Thyroid Peroxidase Antibody  . VITAMIN D 25 Hydroxy (Vit-D Deficiency, Fractures)  . CBC with Differential/Platelet  . Ferritin  . UA/M w/rflx Culture, Routine  . GC/chlamydia probe amp, urine

## 2016-01-03 NOTE — Assessment & Plan Note (Signed)

## 2016-01-05 NOTE — Assessment & Plan Note (Signed)
Will see her back on one month to address benzo use

## 2016-01-05 NOTE — Assessment & Plan Note (Signed)
With what appears to be mild case of angular chelitis; keep corners of mouth dry, use tiny amount of vagisil NOT where it may be ingested; and we'll check vit D

## 2016-01-05 NOTE — Assessment & Plan Note (Addendum)
Previous provider has had numerous patients on Xanax and I simple did not have the time today to challenge this or try to talk her out of it or change it; I did recommend counseling, and to decrease amount some, giving her only 25 pills for one month, and will see her back at f/u and talk then about tapering/switching to SSRI or other means besides just chronic benzos; I did give her my typical speech about benzos, controlled substances in general; I do not plan on continuing this patient long-term on benzos, but again we simple did not have time today to run though the talk it takes to get someone to agree to come off; I will see her back in one month to discuss benzo use

## 2016-01-13 ENCOUNTER — Telehealth: Payer: Self-pay | Admitting: Family Medicine

## 2016-01-13 NOTE — Telephone Encounter (Signed)
Very mild anemia; other labs pending

## 2016-01-14 ENCOUNTER — Other Ambulatory Visit: Payer: Self-pay | Admitting: Family Medicine

## 2016-01-14 DIAGNOSIS — E039 Hypothyroidism, unspecified: Secondary | ICD-10-CM

## 2016-01-14 DIAGNOSIS — E069 Thyroiditis, unspecified: Secondary | ICD-10-CM

## 2016-01-14 DIAGNOSIS — R768 Other specified abnormal immunological findings in serum: Secondary | ICD-10-CM | POA: Insufficient documentation

## 2016-01-14 MED ORDER — LEVOTHYROXINE SODIUM 150 MCG PO TABS
150.0000 ug | ORAL_TABLET | Freq: Every day | ORAL | Status: DC
Start: 2016-01-14 — End: 2016-08-28

## 2016-01-16 LAB — UA/M W/RFLX CULTURE, ROUTINE
Bilirubin, UA: NEGATIVE
Glucose, UA: NEGATIVE
Ketones, UA: NEGATIVE
LEUKOCYTES UA: NEGATIVE
Nitrite, UA: NEGATIVE
PH UA: 6 (ref 5.0–7.5)
Protein, UA: NEGATIVE
RBC UA: NEGATIVE
Specific Gravity, UA: 1.026 (ref 1.005–1.030)
Urobilinogen, Ur: 0.2 mg/dL (ref 0.2–1.0)

## 2016-01-16 LAB — MICROSCOPIC EXAMINATION
Bacteria, UA: NONE SEEN
CASTS: NONE SEEN /LPF

## 2016-01-16 LAB — CBC WITH DIFFERENTIAL/PLATELET
BASOS ABS: 0.1 10*3/uL (ref 0.0–0.2)
Basos: 1 %
EOS (ABSOLUTE): 0.4 10*3/uL (ref 0.0–0.4)
Eos: 6 %
Hematocrit: 32.1 % — ABNORMAL LOW (ref 34.0–46.6)
Hemoglobin: 10.9 g/dL — ABNORMAL LOW (ref 11.1–15.9)
Immature Grans (Abs): 0 10*3/uL (ref 0.0–0.1)
Immature Granulocytes: 0 %
LYMPHS ABS: 2 10*3/uL (ref 0.7–3.1)
Lymphs: 33 %
MCH: 28.4 pg (ref 26.6–33.0)
MCHC: 34 g/dL (ref 31.5–35.7)
MCV: 84 fL (ref 79–97)
MONOS ABS: 0.4 10*3/uL (ref 0.1–0.9)
Monocytes: 6 %
Neutrophils Absolute: 3.3 10*3/uL (ref 1.4–7.0)
Neutrophils: 54 %
PLATELETS: 371 10*3/uL (ref 150–379)
RBC: 3.84 x10E6/uL (ref 3.77–5.28)
RDW: 13.5 % (ref 12.3–15.4)
WBC: 6 10*3/uL (ref 3.4–10.8)

## 2016-01-16 LAB — VITAMIN D 25 HYDROXY (VIT D DEFICIENCY, FRACTURES): VIT D 25 HYDROXY: 26.9 ng/mL — AB (ref 30.0–100.0)

## 2016-01-16 LAB — T3, FREE: T3 FREE: 2.9 pg/mL (ref 2.0–4.4)

## 2016-01-16 LAB — THYROGLOBULIN ANTIBODY

## 2016-01-16 LAB — T4, FREE: Free T4: 1.45 ng/dL (ref 0.82–1.77)

## 2016-01-16 LAB — TSH: TSH: 0.285 u[IU]/mL — ABNORMAL LOW (ref 0.450–4.500)

## 2016-01-16 LAB — FERRITIN: FERRITIN: 7 ng/mL — AB (ref 15–150)

## 2016-01-16 LAB — THYROID PEROXIDASE ANTIBODY: THYROID PEROXIDASE ANTIBODY: 289 [IU]/mL — AB (ref 0–34)

## 2016-01-16 NOTE — Telephone Encounter (Signed)
Note sent to pt, will do stool cards, recheck in near future; see top of lab results

## 2016-01-20 ENCOUNTER — Other Ambulatory Visit: Payer: Self-pay

## 2016-01-21 MED ORDER — BUPROPION HCL ER (SR) 150 MG PO TB12: ORAL_TABLET | ORAL | Status: DC

## 2016-02-10 ENCOUNTER — Ambulatory Visit: Payer: 59 | Admitting: Family Medicine

## 2016-02-14 ENCOUNTER — Other Ambulatory Visit: Payer: Self-pay

## 2016-02-14 MED ORDER — ALPRAZOLAM 0.5 MG PO TABS: 0.2500 mg | ORAL_TABLET | Freq: Every day | ORAL | Status: DC | PRN

## 2016-02-14 NOTE — Telephone Encounter (Signed)
Patient was supposed to have been seen one month after last visit (which was June 2nd) for anxiety and to discuss medicine; she cancelled her appt Has upcoming appt in about 10 days Will allow small amount, needs to last until she is seen

## 2016-02-18 ENCOUNTER — Telehealth: Payer: Self-pay | Admitting: Family Medicine

## 2016-02-18 NOTE — Telephone Encounter (Signed)
[facility]ri Garrison from [facility] requesting return call. You had left a voice message and she is needing clarification. [phone]

## 2016-02-20 NOTE — Telephone Encounter (Signed)
I have tried to call pharmacy sereval times

## 2016-02-24 ENCOUNTER — Ambulatory Visit (INDEPENDENT_AMBULATORY_CARE_PROVIDER_SITE_OTHER): Payer: 59 | Admitting: Family Medicine

## 2016-02-24 ENCOUNTER — Encounter: Payer: Self-pay | Admitting: Family Medicine

## 2016-02-24 VITALS — BP 118/72 | HR 94 | Temp 99.7°F | Resp 16 | Wt 154.0 lb

## 2016-02-24 DIAGNOSIS — R894 Abnormal immunological findings in specimens from other organs, systems and tissues: Secondary | ICD-10-CM

## 2016-02-24 DIAGNOSIS — F418 Other specified anxiety disorders: Secondary | ICD-10-CM | POA: Diagnosis not present

## 2016-02-24 DIAGNOSIS — E039 Hypothyroidism, unspecified: Secondary | ICD-10-CM

## 2016-02-24 DIAGNOSIS — F410 Panic disorder [episodic paroxysmal anxiety] without agoraphobia: Secondary | ICD-10-CM

## 2016-02-24 DIAGNOSIS — Z79899 Other long term (current) drug therapy: Secondary | ICD-10-CM | POA: Diagnosis not present

## 2016-02-24 DIAGNOSIS — R768 Other specified abnormal immunological findings in serum: Secondary | ICD-10-CM

## 2016-02-24 MED ORDER — ALPRAZOLAM 0.5 MG PO TABS: 0.2500 mg | ORAL_TABLET | Freq: Every day | ORAL | 2 refills | Status: DC | PRN

## 2016-02-24 MED ORDER — BUPROPION HCL ER (SR) 150 MG PO TB12: ORAL_TABLET | ORAL | 3 refills | Status: DC

## 2016-02-24 MED ORDER — SERTRALINE HCL 50 MG PO TABS: 50.0000 mg | ORAL_TABLET | Freq: Every day | ORAL | 3 refills | Status: DC

## 2016-02-24 NOTE — Assessment & Plan Note (Signed)
Doing well with current medicines; so glad she is going to start counseling; use the benzo less and less over coming months; our goal is to have her off of benzos in the coming 3-6 months

## 2016-02-24 NOTE — Patient Instructions (Addendum)
Continue current medicines Use less and less of the alprazolam as you are able Best wishes as you start counseling Return in 3 months  12 Ways to Curb Anxiety  ?Anxiety is normal human sensation. It is what helped our ancestors survive the pitfalls of the wilderness. Anxiety is defined as experiencing worry or nervousness about an imminent event or something with an uncertain outcome. It is a feeling experienced by most people at some point in their lives. Anxiety can be triggered by a very personal issue, such as the illness of a loved one, or an event of global proportions, such as a refugee crisis. Some of the symptoms of anxiety are:  Feeling restless.  Having a feeling of impending danger.  Increased heart rate.  Rapid breathing. Sweating.  Shaking.  Weakness or feeling tired.  Difficulty concentrating on anything except the current worry.  Insomnia.  Stomach or bowel problems. What can we do about anxiety we may be feeling? There are many techniques to help manage stress and relax. Here are 12 ways you can reduce your anxiety almost immediately: 1. Turn off the constant feed of information. Take a social media sabbatical. Studies have shown that social media directly contributes to social anxiety.  2. Monitor your television viewing habits. Are you watching shows that are also contributing to your anxiety, such as 24-hour news stations? Try watching something else, or better yet, nothing at all. Instead, listen to music, read an inspirational book or practice a hobby. 3. Eat nutritious meals. Also, don't skip meals and keep healthful snacks on hand. Hunger and poor diet contributes to feeling anxious. 4. Sleep. Sleeping on a regular schedule for at least seven to eight hours a night will do wonders for your outlook when you are awake. 5. Exercise. Regular exercise will help rid your body of that anxious energy and help you get more restful sleep. 6. Try deep (diaphragmatic) breathing.  Inhale slowly through your nose for five seconds and exhale through your mouth. 7. Practice acceptance and gratitude. When anxiety hits, accept that there are things out of your control that shouldn't be of immediate concern.  8. Seek out humor. When anxiety strikes, watch a funny video, read jokes or call a friend who makes you laugh. Laughter is healing for our bodies and releases endorphins that are calming. 9. Stay positive. Take the effort to replace negative thoughts with positive ones. Try to see a stressful situation in a positive light. Try to come up with solutions rather than dwelling on the problem. 10. Figure out what triggers your anxiety. Keep a journal and make note of anxious moments and the events surrounding them. This will help you identify triggers you can avoid or even eliminate. 11. Talk to someone. Let a trusted friend, family member or even trained professional know that you are feeling overwhelmed and anxious. Verbalize what you are feeling and why.  12. Volunteer. If your anxiety is triggered by a crisis on a large scale, become an advocate and work to resolve the problem that is causing you unease. Anxiety is often unwelcome and can become overwhelming. If not kept in check, it can become a disorder that could require medical treatment. However, if you take the time to care for yourself and avoid the triggers that make you anxious, you will be able to find moments of relaxation and clarity that make your life much more enjoyable.

## 2016-02-24 NOTE — Assessment & Plan Note (Signed)
Seeing endo

## 2016-02-24 NOTE — Assessment & Plan Note (Signed)
noted 

## 2016-02-24 NOTE — Progress Notes (Signed)
BP 118/72   Pulse 94   Temp 99.7 F (37.6 C) (Oral)   Resp 16   Wt 154 lb (69.9 kg)   LMP 02/03/2016   SpO2 96%   BMI 28.17 kg/m    Subjective:    Patient ID: Sheri Garrison, female    DOB: 08-Feb-1976, 40 y.o.   MRN: [id number]  HPI: Sheri Garrison is a 40 y.o. female  Chief Complaint  Patient presents with  . Medication Refill  . Follow-up   She is here for follow-up of anxiety She is taking the zoloft daily She is on the lower amount of xanax; taking just half at a time, and that is working well; she tried to stop it completely for a few weeks, but her anxiety was up continuously; she thinks she can take the half dose During the day, she's anxious, but she has things to do and uses the anxious energy; at night, trouble winding down at night; has trouble relaxing, wakes up during the night; makes her a little groggy at night and that's okay She gets irritated and agitated a lot and takes it out on her husband at home; she might take a rare dose during the day if really upset, does not drive and does not drink with medicine No nausea or headaches on the zoloft; has taken it for years; has tried to come off of it in the past and does not do well Ran out of wellbutrin and needs refill; her energy level and motivation do better on the wellbutrin  Hypothyroidism; TSH 4 months ago was >21, and then last TSH was 0.285 and saw endo; she's leaving her on that dose; recheck next months; over last few years, hair has really thinned; skin stays dry  Depression screen [facility] 2/9 02/24/2016 01/03/2016 07/30/2015 04/09/2015  Decreased Interest 0 0 0 0  Down, Depressed, Hopeless 0 0 0 0  PHQ - 2 Score 0 0 0 0   GAD 7 : Generalized Anxiety Score 02/24/2016  Nervous, Anxious, on Edge 3  Control/stop worrying 1  Worry too much - different things 2  Trouble relaxing 3  Restless 1  Easily annoyed or irritable 2  Afraid - awful might happen 0  Total GAD 7 Score 12  Anxiety Difficulty Somewhat  difficult    Relevant past medical, surgical, family and social history reviewed Past Medical History:  Diagnosis Date  . Anxiety   . Chronic anxiety 01/03/2016  . Hypothyroidism, adult 04/09/2015  . Panic disorder without agoraphobia 01/03/2016   Social History  Substance Use Topics  . Smoking status: Never Smoker  . Smokeless tobacco: Not on file  . Alcohol use 0.0 oz/week     Comment: occasionally   Interim medical history since last visit reviewed. Allergies and medications reviewed  Review of Systems Per HPI unless specifically indicated above     Objective:    BP 118/72   Pulse 94   Temp 99.7 F (37.6 C) (Oral)   Resp 16   Wt 154 lb (69.9 kg)   LMP 02/03/2016   SpO2 96%   BMI 28.17 kg/m   Wt Readings from Last 3 Encounters:  02/24/16 154 lb (69.9 kg)  01/03/16 150 lb (68 kg)  07/30/15 157 lb 12.8 oz (71.6 kg)    Physical Exam  Constitutional: She appears well-developed and well-nourished.  HENT:  Mouth/Throat: Mucous membranes are normal.  Eyes: EOM are normal. No scleral icterus.  Cardiovascular: Normal rate and regular rhythm.  Pulmonary/Chest: Effort normal and breath sounds normal.  Neurological: She displays no tremor.  Reflex Scores:      Patellar reflexes are 2+ on the right side and 2+ on the left side. Skin: No pallor.  Psychiatric: She has a normal mood and affect. Her behavior is normal.   Results for orders placed or performed in visit on 01/03/16  Microscopic Examination  Result Value Ref Range   WBC, UA 0-5 0 - 5 /hpf   RBC, UA 0-2 0 - 2 /hpf   Epithelial Cells (non renal) 0-10 0 - 10 /hpf   Casts None seen None seen /lpf   Mucus, UA Present Not Estab.   Bacteria, UA None seen None seen/Few  T4, free  Result Value Ref Range   Free T4 1.45 0.82 - 1.77 ng/dL  TSH  Result Value Ref Range   TSH 0.285 (L) 0.450 - 4.500 uIU/mL  T3, free  Result Value Ref Range   T3, Free 2.9 2.0 - 4.4 pg/mL  Thyroglobulin antibody  Result Value Ref  Range   Thyroglobulin Antibody <1.0 0.0 - 0.9 IU/mL  Thyroid Peroxidase Antibody  Result Value Ref Range   Thyroperoxidase Ab SerPl-aCnc 289 (H) 0 - 34 IU/mL  VITAMIN D 25 Hydroxy (Vit-D Deficiency, Fractures)  Result Value Ref Range   Vit D, 25-Hydroxy 26.9 (L) 30.0 - 100.0 ng/mL  CBC with Differential/Platelet  Result Value Ref Range   WBC 6.0 3.4 - 10.8 x10E3/uL   RBC 3.84 3.77 - 5.28 x10E6/uL   Hemoglobin 10.9 (L) 11.1 - 15.9 g/dL   Hematocrit 16.1 (L) 09.6 - 46.6 %   MCV 84 79 - 97 fL   MCH 28.4 26.6 - 33.0 pg   MCHC 34.0 31.5 - 35.7 g/dL   RDW 04.5 40.9 - 81.1 %   Platelets 371 150 - 379 x10E3/uL   Neutrophils 54 %   Lymphs 33 %   Monocytes 6 %   Eos 6 %   Basos 1 %   Neutrophils Absolute 3.3 1.4 - 7.0 x10E3/uL   Lymphocytes Absolute 2.0 0.7 - 3.1 x10E3/uL   Monocytes Absolute 0.4 0.1 - 0.9 x10E3/uL   EOS (ABSOLUTE) 0.4 0.0 - 0.4 x10E3/uL   Basophils Absolute 0.1 0.0 - 0.2 x10E3/uL   Immature Granulocytes 0 %   Immature Grans (Abs) 0.0 0.0 - 0.1 x10E3/uL   Hematology Comments: Note:   Ferritin  Result Value Ref Range   Ferritin 7 (L) 15 - 150 ng/mL  UA/M w/rflx Culture, Routine  Result Value Ref Range   Specific Gravity, UA 1.026 1.005 - 1.030   pH, UA 6.0 5.0 - 7.5   Color, UA Yellow Yellow   Appearance Ur Clear Clear   Leukocytes, UA Negative Negative   Protein, UA Negative Negative/Trace   Glucose, UA Negative Negative   Ketones, UA Negative Negative   RBC, UA Negative Negative   Bilirubin, UA Negative Negative   Urobilinogen, Ur 0.2 0.2 - 1.0 mg/dL   Nitrite, UA Negative Negative   Microscopic Examination Comment    Microscopic Examination See below:    Urinalysis Reflex Comment       Assessment & Plan:   Problem List Items Addressed This Visit      Endocrine   Hypothyroidism, adult     Other   Panic disorder without agoraphobia - Primary    Doing well with current medicines; so glad she is going to start counseling; use the benzo less and less  over coming months;  our goal is to have her off of benzos in the coming 3-6 months      Controlled substance agreement signed    noted      Anti-TPO antibodies present    Seeing endo       Other Visit Diagnoses    Depression with anxiety       Relevant Medications   sertraline (ZOLOFT) 50 MG tablet       Follow up plan: Return in about 3 months (around 05/26/2016) for follow-up.  An after-visit summary was printed and given to the patient at check-out.  Please see the patient instructions which may contain other information and recommendations beyond what is mentioned above in the assessment and plan.  Meds ordered this encounter  Medications  . buPROPion (WELLBUTRIN SR) 150 MG 12 hr tablet    Sig: TAKE 1 TABLET (150 MG TOTAL) BY MOUTH 2 (TWO) TIMES DAILY.    Dispense:  180 tablet    Refill:  3  . sertraline (ZOLOFT) 50 MG tablet    Sig: Take 1 tablet (50 mg total) by mouth daily.    Dispense:  90 tablet    Refill:  3  . ALPRAZolam (XANAX) 0.5 MG tablet    Sig: Take 0.5-1 tablets (0.25-0.5 mg total) by mouth daily as needed.    Dispense:  10 tablet    Refill:  2    fax [facility] [address]    No orders of the defined types were placed in this encounter.

## 2016-05-25 ENCOUNTER — Ambulatory Visit: Payer: 59 | Admitting: Family Medicine

## 2016-05-25 ENCOUNTER — Encounter: Payer: Self-pay | Admitting: Family Medicine

## 2016-05-25 DIAGNOSIS — F418 Other specified anxiety disorders: Secondary | ICD-10-CM

## 2016-05-25 MED ORDER — SERTRALINE HCL 50 MG PO TABS: 50.0000 mg | ORAL_TABLET | Freq: Every day | ORAL | 1 refills | Status: DC

## 2016-05-25 MED ORDER — BUPROPION HCL ER (SR) 150 MG PO TB12: ORAL_TABLET | ORAL | 1 refills | Status: DC

## 2016-05-28 ENCOUNTER — Encounter: Payer: Self-pay | Admitting: Family Medicine

## 2016-05-28 ENCOUNTER — Ambulatory Visit (INDEPENDENT_AMBULATORY_CARE_PROVIDER_SITE_OTHER): Payer: 59 | Admitting: Family Medicine

## 2016-05-28 DIAGNOSIS — E039 Hypothyroidism, unspecified: Secondary | ICD-10-CM | POA: Diagnosis not present

## 2016-05-28 DIAGNOSIS — F418 Other specified anxiety disorders: Secondary | ICD-10-CM | POA: Diagnosis not present

## 2016-05-28 DIAGNOSIS — Z79899 Other long term (current) drug therapy: Secondary | ICD-10-CM

## 2016-05-28 DIAGNOSIS — F419 Anxiety disorder, unspecified: Secondary | ICD-10-CM

## 2016-05-28 MED ORDER — SERTRALINE HCL 50 MG PO TABS: ORAL_TABLET | ORAL | 0 refills | Status: DC

## 2016-05-28 MED ORDER — ALPRAZOLAM 0.5 MG PO TABS: 0.2500 mg | ORAL_TABLET | Freq: Every day | ORAL | 0 refills | Status: DC | PRN

## 2016-05-28 MED ORDER — BUPROPION HCL ER (SR) 150 MG PO TB12: ORAL_TABLET | ORAL | 0 refills | Status: DC

## 2016-05-28 NOTE — Patient Instructions (Addendum)
Find time well before bed to process your daily thoughts and don't take them to bed with you Let's increase your sertraline and call me if any mania or hypomania Anxiety is in the future, depression is in the past, so ground and center yourself in the moment NOW Return in 3 months, but call sooner if needed

## 2016-05-28 NOTE — Progress Notes (Signed)
BP 116/70 (BP Location: Left Arm, Patient Position: Sitting, Cuff Size: Large)   Pulse 81   Temp 98.3 F (36.8 C) (Oral)   Resp 16   Ht 5' 2" (1.575 m)   Wt 155 lb 9.6 oz (70.6 kg)   LMP 05/24/2016   SpO2 95%   BMI 28.46 kg/m    Subjective:    Patient ID: Sheri Garrison, female    DOB: 1976-06-10, 41 y.o.   MRN: [id number]  HPI: Sheri Garrison is a 40 y.o. female  Chief Complaint  Patient presents with  . Medication Refill    3 month F/U  . Anxiety    Doing well has some days it is worst than others   Anxiety; trouble relaxing; not doing any sort of exercise or yoga or tai chi; she likes to read; she was referred to a counselor, going to her now; has not been in the past couple of weeks because of her kids' schedules; counselor has helped her with a few books; sleeping okay, sleeping better than she used to in the past; last night was not a good night; gets worked up at the end of the day; she is just so busy during the day; doesn't have time to think about things during the day  [place] web site reviewed; 10 pills filled 05/03/16; doesn't have any left; our goal was to get her off of those; getting ready to quit her job; she has been there for 15 years   Will get flu shot at work soon  She is now seeing endocrinologist at [facility] for [place]; last TSH was 0.543 and last free T4 was 0.92, both within normal range; drawn on 03/17/2016 (see labs in [facility])  Depression screen [facility] 2/9 05/28/2016 02/24/2016 01/03/2016 07/30/2015 04/09/2015  Decreased Interest 0 0 0 0 0  Down, Depressed, Hopeless 0 0 0 0 0  PHQ - 2 Score 0 0 0 0 0   GAD 7 : Generalized Anxiety Score 05/28/2016 02/24/2016  Nervous, Anxious, on Edge 2 3  Control/stop worrying 1 1  Worry too much - different things 2 2  Trouble relaxing 3 3  Restless 2 1  Easily annoyed or irritable 2 2  Afraid - awful might happen 0 0  Total GAD 7 Score 12 12  Anxiety Difficulty Somewhat difficult Somewhat difficult    Relevant past medical, surgical, family and social history reviewed Past Medical History:  Diagnosis Date  . Anxiety   . Chronic anxiety 01/03/2016  . Hypothyroidism, adult 04/09/2015  . Panic disorder without agoraphobia 01/03/2016   Past Surgical History:  Procedure Laterality Date  . CHOLECYSTECTOMY  2004  . LAPAROSCOPIC GASTRIC SLEEVE RESECTION  2014   Family History  Problem Relation Age of Onset  . Heart disease Mother   . Hyperlipidemia Mother   . Hypertension Mother   . Breast cancer Mother 60  . Hearing loss Father     due to age  . Depression Maternal Aunt   . Hyperlipidemia Maternal Aunt   . Hypertension Maternal Aunt   . Breast cancer Maternal Aunt     50's  . Arthritis Maternal Grandmother   . Hyperlipidemia Maternal Grandmother   . Hypertension Maternal Grandmother   . Heart disease Maternal Grandfather   . Hyperlipidemia Maternal Grandfather   . Hypertension Maternal Grandfather   . Stroke Paternal Grandmother   . Heart disease Paternal Grandfather    Social History  Substance Use Topics  . Smoking status:  Never Smoker  . Smokeless tobacco: Never Used  . Alcohol use 0.0 oz/week     Comment: occasionally   Interim medical history since last visit reviewed. Allergies and medications reviewed  Review of Systems Per HPI unless specifically indicated above     Objective:    BP 116/70 (BP Location: Left Arm, Patient Position: Sitting, Cuff Size: Large)   Pulse 81   Temp 98.3 F (36.8 C) (Oral)   Resp 16   Ht 5' 2" (1.575 m)   Wt 155 lb 9.6 oz (70.6 kg)   LMP 05/24/2016   SpO2 95%   BMI 28.46 kg/m   Wt Readings from Last 3 Encounters:  05/28/16 155 lb 9.6 oz (70.6 kg)  02/24/16 154 lb (69.9 kg)  01/03/16 150 lb (68 kg)    Physical Exam  Constitutional: She appears well-developed and well-nourished.  HENT:  Mouth/Throat: Mucous membranes are normal.  Eyes: EOM are normal. No scleral icterus.  Cardiovascular: Normal rate and regular rhythm.    Pulmonary/Chest: Effort normal and breath sounds normal.  Neurological: She displays no tremor.  No tics  Skin: No pallor.  Psychiatric: She has a normal mood and affect. Her behavior is normal. Her mood appears not anxious. Her affect is not blunt. She does not express impulsivity or inappropriate judgment. She does not exhibit a depressed mood.    Results for orders placed or performed in visit on 01/03/16  Microscopic Examination  Result Value Ref Range   WBC, UA 0-5 0 - 5 /hpf   RBC, UA 0-2 0 - 2 /hpf   Epithelial Cells (non renal) 0-10 0 - 10 /hpf   Casts None seen None seen /lpf   Mucus, UA Present Not Estab.   Bacteria, UA None seen None seen/Few  T4, free  Result Value Ref Range   Free T4 1.45 0.82 - 1.77 ng/dL  TSH  Result Value Ref Range   TSH 0.285 (L) 0.450 - 4.500 uIU/mL  T3, free  Result Value Ref Range   T3, Free 2.9 2.0 - 4.4 pg/mL  Thyroglobulin antibody  Result Value Ref Range   Thyroglobulin Antibody <1.0 0.0 - 0.9 IU/mL  Thyroid Peroxidase Antibody  Result Value Ref Range   Thyroperoxidase Ab SerPl-aCnc 289 (H) 0 - 34 IU/mL  VITAMIN D 25 Hydroxy (Vit-D Deficiency, Fractures)  Result Value Ref Range   Vit D, 25-Hydroxy 26.9 (L) 30.0 - 100.0 ng/mL  CBC with Differential/Platelet  Result Value Ref Range   WBC 6.0 3.4 - 10.8 x10E3/uL   RBC 3.84 3.77 - 5.28 x10E6/uL   Hemoglobin 10.9 (L) 11.1 - 15.9 g/dL   Hematocrit 32.1 (L) 34.0 - 46.6 %   MCV 84 79 - 97 fL   MCH 28.4 26.6 - 33.0 pg   MCHC 34.0 31.5 - 35.7 g/dL   RDW 13.5 12.3 - 15.4 %   Platelets 371 150 - 379 x10E3/uL   Neutrophils 54 %   Lymphs 33 %   Monocytes 6 %   Eos 6 %   Basos 1 %   Neutrophils Absolute 3.3 1.4 - 7.0 x10E3/uL   Lymphocytes Absolute 2.0 0.7 - 3.1 x10E3/uL   Monocytes Absolute 0.4 0.1 - 0.9 x10E3/uL   EOS (ABSOLUTE) 0.4 0.0 - 0.4 x10E3/uL   Basophils Absolute 0.1 0.0 - 0.2 x10E3/uL   Immature Granulocytes 0 %   Immature Grans (Abs) 0.0 0.0 - 0.1 x10E3/uL   Hematology  Comments: Note:   Ferritin  Result Value Ref Range  Ferritin 7 (L) 15 - 150 ng/mL  UA/M w/rflx Culture, Routine  Result Value Ref Range   Specific Gravity, UA 1.026 1.005 - 1.030   pH, UA 6.0 5.0 - 7.5   Color, UA Yellow Yellow   Appearance Ur Clear Clear   Leukocytes, UA Negative Negative   Protein, UA Negative Negative/Trace   Glucose, UA Negative Negative   Ketones, UA Negative Negative   RBC, UA Negative Negative   Bilirubin, UA Negative Negative   Urobilinogen, Ur 0.2 0.2 - 1.0 mg/dL   Nitrite, UA Negative Negative   Microscopic Examination Comment    Microscopic Examination See below:    Urinalysis Reflex Comment       Assessment & Plan:   Problem List Items Addressed This Visit      Endocrine   Hypothyroidism, adult    Seeing endocrinologist now; hyperthyroidism can exacerbate symptoms of anxiety, so important to f/u; last TSH in normal range      Relevant Medications   levothyroxine (SYNTHROID, LEVOTHROID) 175 MCG tablet     Other   Depression with anxiety    Will increase the SSRI; hope to cut out the benzo altogether soon; call with update      Relevant Medications   sertraline (ZOLOFT) 50 MG tablet   Controlled substance agreement signed    Reviewed [place] web site; no red flags; giving very limited Rx for benzo and then hope to stop it soon      Chronic anxiety    Explained to patient that I have full intentions of getting her off of the benzo; explained that it acts like alcohol in pill form; would much prefer her to use mindfulness, counseling, CBT, etc to help with symptoms of anxiety; will allow a very limited amount this time for her to use in truly stormy moments; do not plan to continue beyond next month or two; increase SSRI, call with any dark thoughts, seek immediate help if SI/HI; talked with patient about anxiety and depression, processing her thoughts and concerns earlier in the day than when she lays down at night to go to bed       Relevant Medications   sertraline (ZOLOFT) 50 MG tablet   buPROPion (WELLBUTRIN SR) 150 MG 12 hr tablet   ALPRAZolam (XANAX) 0.5 MG tablet    Other Visit Diagnoses   None.      Follow up plan: Return in about 3 months (around 08/28/2016) for medication, anxiety.  An after-visit summary was printed and given to the patient at [place].  Please see the patient instructions which may contain other information and recommendations beyond what is mentioned above in the assessment and plan.  Meds ordered this encounter  Medications  . levothyroxine (SYNTHROID, LEVOTHROID) 175 MCG tablet    Sig: Take 175 mcg by mouth daily.    Refill:  6  . DISCONTD: buPROPion (WELLBUTRIN SR) 150 MG 12 hr tablet    Sig: TAKE 1 TABLET (150 MG TOTAL) BY MOUTH 2 (TWO) TIMES DAILY.    Refill:  3  . Multiple Vitamin (MULTIVITAMIN) tablet    Sig: Take 1 tablet by mouth daily.  . sertraline (ZOLOFT) 50 MG tablet    Sig: One and one-half pill by mouth daily for two weeks, then two pills daily    Dispense:  46 tablet    Refill:  0  . buPROPion (WELLBUTRIN SR) 150 MG 12 hr tablet    Sig: TAKE 1 TABLET (150 MG TOTAL) BY MOUTH  2 (TWO) TIMES DAILY.    Dispense:  60 tablet    Refill:  0  . ALPRAZolam (XANAX) 0.5 MG tablet    Sig: Take 0.5-1 tablets (0.25-0.5 mg total) by mouth daily as needed.    Dispense:  5 tablet    Refill:  0    fax [facility] [address]on    No orders of the defined types were placed in this encounter. Face-to-face time with patient was more than 25 minutes, >50% time spent counseling and coordination of care

## 2016-06-04 DIAGNOSIS — F418 Other specified anxiety disorders: Secondary | ICD-10-CM | POA: Insufficient documentation

## 2016-06-04 NOTE — Assessment & Plan Note (Signed)
Seeing endocrinologist now; hyperthyroidism can exacerbate symptoms of anxiety, so important to f/u; last TSH in normal range

## 2016-06-04 NOTE — Assessment & Plan Note (Addendum)
Explained to patient that I have full intentions of getting her off of the benzo; explained that it acts like alcohol in pill form; would much prefer her to use mindfulness, counseling, CBT, etc to help with symptoms of anxiety; will allow a very limited amount this time for her to use in truly stormy moments; do not plan to continue beyond next month or two; increase SSRI, call with any dark thoughts, seek immediate help if SI/HI; talked with patient about anxiety and depression, processing her thoughts and concerns earlier in the day than when she lays down at night to go to bed

## 2016-06-04 NOTE — Assessment & Plan Note (Signed)
Will increase the SSRI; hope to cut out the benzo altogether soon; call with update

## 2016-06-04 NOTE — Assessment & Plan Note (Signed)
Reviewed the [place] web site; no red flags; giving very limited Rx for benzo and then hope to stop it soon

## 2016-08-16 ENCOUNTER — Other Ambulatory Visit: Payer: Self-pay | Admitting: Family Medicine

## 2016-08-16 ENCOUNTER — Encounter: Payer: Self-pay | Admitting: Family Medicine

## 2016-08-17 ENCOUNTER — Telehealth: Payer: Self-pay | Admitting: Family Medicine

## 2016-08-17 MED ORDER — ALPRAZOLAM 0.5 MG PO TABS: 0.2500 mg | ORAL_TABLET | Freq: Every day | ORAL | 0 refills | Status: DC | PRN

## 2016-08-17 NOTE — Telephone Encounter (Signed)
PT SAID THAT SHWE PLACED A MY CHART MESSAGE TO DR LADA OVER THE WEEKEND ASKING IF SHE COULD GIVE HER JUST 4 ALPRAZOLM FOR SHE IS HAVING TO FLY OUT AROUND 12 TODAY AND SHE IS JUST ASKING FOR ENOUGH TO GET HER THRU THAT. [facility] [address]

## 2016-08-17 NOTE — Telephone Encounter (Signed)
Just saw that she needs emergency supply, we will send enough until Wed.

## 2016-08-28 ENCOUNTER — Encounter: Payer: Self-pay | Admitting: Family Medicine

## 2016-08-28 ENCOUNTER — Ambulatory Visit (INDEPENDENT_AMBULATORY_CARE_PROVIDER_SITE_OTHER): Payer: 59 | Admitting: Family Medicine

## 2016-08-28 VITALS — BP 114/60 | HR 76 | Temp 97.0°F | Resp 14 | Wt 160.6 lb

## 2016-08-28 DIAGNOSIS — F418 Other specified anxiety disorders: Secondary | ICD-10-CM | POA: Diagnosis not present

## 2016-08-28 DIAGNOSIS — E039 Hypothyroidism, unspecified: Secondary | ICD-10-CM

## 2016-08-28 DIAGNOSIS — Z23 Encounter for immunization: Secondary | ICD-10-CM | POA: Diagnosis not present

## 2016-08-28 DIAGNOSIS — F410 Panic disorder [episodic paroxysmal anxiety] without agoraphobia: Secondary | ICD-10-CM | POA: Diagnosis not present

## 2016-08-28 MED ORDER — SERTRALINE HCL 50 MG PO TABS: 50.0000 mg | ORAL_TABLET | Freq: Every day | ORAL | 3 refills | Status: DC

## 2016-08-28 NOTE — Progress Notes (Signed)
BP 114/60   Pulse 76   Temp 97 F (36.1 C) (Oral)   Resp 14   Wt 160 lb 9 oz (72.8 kg)   LMP 08/10/2016   SpO2 97%   BMI 29.37 kg/m    Subjective:    Patient ID: Sheri Garrison, female    DOB: 10-Jan-1976, 41 y.o.   MRN: [id number]  HPI: Sheri Garrison is a 41 y.o. female  Chief Complaint  Patient presents with  . Follow-up    medication for anxiety    She has a thyroid condition; has had the condition for 20 years; saw the specialist for 5-10 minutes; was charged $400; [facility] did labs They kept her on 175 mcg daily; that dose has been stable for awhile; BMs regular; no excessive hair loss; put on a few pounds Hashimoto's is the diagnosis TSH July 9.839, then TSH 0.543 in August  She is on the sertraline to 100 mg but would like to go to 50 mg daily Had been changing jobs and stress had been rough New job and anxiety level as a whole has really decreased She is only taking alprazolam when she flies; works for [employer]; works with [employer]; had to to go to [place] last week; goes to [place] next month, travels every month or so or every other month Has not been working with counselor, because of scheduled; changed job in November; she'll reach back out if needed  [facility] 08/28/2016 05/28/2016 02/24/2016 01/03/2016 07/30/2015  Decreased Interest 0 0 0 0 0  Down, Depressed, Hopeless 0 0 0 0 0  PHQ - 2 Score 0 0 0 0 0   Relevant past medical, surgical, family and social history reviewed Past Medical History:  Diagnosis Date  . Anxiety   . Chronic anxiety 01/03/2016  . Hypothyroidism, adult 04/09/2015  . Panic disorder without agoraphobia 01/03/2016   Past Surgical History:  Procedure Laterality Date  . CHOLECYSTECTOMY  2004  . LAPAROSCOPIC GASTRIC SLEEVE RESECTION  2014   Social History  Substance Use Topics  . Smoking status: Never Smoker  . Smokeless tobacco: Never Used  . Alcohol use 0.0  oz/week     Comment: occasionally   Interim medical history since last visit reviewed. Allergies and medications reviewed  Review of Systems Per HPI unless specifically indicated above     Objective:    BP 114/60   Pulse 76   Temp 97 F (36.1 C) (Oral)   Resp 14   Wt 160 lb 9 oz (72.8 kg)   LMP 08/10/2016   SpO2 97%   BMI 29.37 kg/m   Wt Readings from Last 3 Encounters:  08/28/16 160 lb 9 oz (72.8 kg)  05/28/16 155 lb 9.6 oz (70.6 kg)  02/24/16 154 lb (69.9 kg)    Physical Exam  Constitutional: She appears well-developed and well-nourished.  HENT:  Mouth/Throat: Mucous membranes are normal.  Eyes: EOM are normal. No scleral icterus.  Neck: No thyroid mass and no thyromegaly present.  Cardiovascular: Normal rate and regular rhythm.   Pulmonary/Chest: Effort normal and breath sounds normal.  Neurological: She displays no tremor.  No tics  Skin: No pallor.  Psychiatric: She has a normal mood and affect. Her behavior is normal. Her mood appears not anxious. Her speech is not rapid and/or pressured and not delayed. She does not express impulsivity or inappropriate judgment. She does not exhibit a depressed mood.  Assessment & Plan:   Problem List Items Addressed This Visit      Endocrine   Hypothyroidism, adult    Due to Hashimoto's; recheck TSH and free T4 today; especially with weight gain, but may be winter pounds; adjust dose if necessary      Relevant Orders   TSH   T4, free     Other   Panic disorder without agoraphobia    Encouraged relaxation response for air travel, other anxiety-provoking conditions; while we won't plan to use alprazolam long-term for anxiety, I am willing to prescribe some for air travel if needed      Depression with anxiety - Primary    Will continue the sertraline at 50 mg at her request; one year of refills sent to mail order pharmacy       Other Visit Diagnoses    Needs flu shot       Relevant Orders   Flu Vaccine QUAD  36+ mos PF IM (Fluarix & Fluzone Quad PF) (Completed)       Follow up plan: Return in about 3 months (around 11/26/2016) for medication follow-up.  An after-visit summary was printed and given to the patient at check-out.  Please see the patient instructions which may contain other information and recommendations beyond what is mentioned above in the assessment and plan.  Meds ordered this encounter  Medications  . sertraline (ZOLOFT) 50 MG tablet    Sig: Take 1 tablet (50 mg total) by mouth daily.    Dispense:  90 tablet    Refill:  3    Orders Placed This Encounter  Procedures  . Flu Vaccine QUAD 36+ mos PF IM (Fluarix & Fluzone Quad PF)  . TSH  . T4, free

## 2016-08-28 NOTE — Assessment & Plan Note (Signed)
Due to Hashimoto's; recheck TSH and free T4 today; especially with weight gain, but may be winter pounds; adjust dose if necessary

## 2016-08-28 NOTE — Assessment & Plan Note (Signed)
Will continue the sertraline at 50 mg at her request; one year of refills sent to mail order pharmacy

## 2016-08-28 NOTE — Assessment & Plan Note (Addendum)
Encouraged relaxation response for air travel, other anxiety-provoking conditions; while we won't plan to use alprazolam long-term for anxiety, I am willing to prescribe some for air travel if needed

## 2016-08-28 NOTE — Patient Instructions (Addendum)
Let's get labs today Try the relaxation response You received the flu shot today; it should protect you against the flu virus over the coming months; it will take about two weeks for antibodies to develop; do try to stay away from hospitals, nursing homes, and daycares during peak flu season; taking extra vitamin C daily during flu season may help you avoid getting sick   Steps to Elicit the Relaxation Response The following is the technique reprinted with permission from Dr. Billy FischerHerbert Benson's book The Relaxation Response pages 162-163 1. Sit quietly in a comfortable position. 2. Close your eyes. 3. Deeply relax all your muscles,  beginning at your feet and progressing up to your face.  Keep them relaxed. 4. Breathe through your nose.  Become aware of your breathing.  As you breathe out, say the word, "one"*,  silently to yourself. For example,  breathe in ... out, "one",- in .. out, "one", etc.  Breathe easily and naturally. 5. Continue for 10 to 20 minutes.  You may open your eyes to check the time, but do not use an alarm.  When you finish, sit quietly for several minutes,  at first with your eyes closed and later with your eyes opened.  Do not stand up for a few minutes. 6. Do not worry about whether you are successful  in achieving a deep level of relaxation.  Maintain a passive attitude and permit relaxation to occur at its own pace.  When distracting thoughts occur,  try to ignore them by not dwelling upon them  and return to repeating "one."  With practice, the response should come with little effort.  Practice the technique once or twice daily,  but not within two hours after any meal,  since the digestive processes seem to interfere with  the elicitation of the Relaxation Response. * It is better to use a soothing, mellifluous sound, preferably with no meaning. or association, to avoid stimulation of unnecessary thoughts - a mantra.

## 2016-09-05 ENCOUNTER — Other Ambulatory Visit: Payer: Self-pay | Admitting: Family Medicine

## 2016-09-05 NOTE — Progress Notes (Signed)
Labs were ordered on 08/28/16 for thyroid studies; I do not have those results back Please check on those, resolve; thank you

## 2016-09-10 NOTE — Progress Notes (Signed)
She never had the labs done. I called patient twice today and on 09/05/2016 to get a better understanding. No luck left patient message both times. If I do not get a call back I will mail out a letter.

## 2016-09-24 ENCOUNTER — Other Ambulatory Visit: Payer: Self-pay

## 2016-09-24 ENCOUNTER — Telehealth: Payer: Self-pay | Admitting: Family Medicine

## 2016-09-24 DIAGNOSIS — E039 Hypothyroidism, unspecified: Secondary | ICD-10-CM

## 2016-09-24 NOTE — Telephone Encounter (Signed)
I still was not successful. I mention on the voicemail that she can come in grab the lab orders whenever she get a chance and take them to [facility] when she get a chance. I will go ahead and change the lab orders to [facility]  And put them up front and send out a letter.

## 2016-09-24 NOTE — Telephone Encounter (Signed)
Progress Notes by Delanna NoticeAmber Nichole Parrish, CMA at 09/05/2016 8:20 AM   Author: Delanna NoticeAmber Nichole Parrish, CMA Author Type: Certified Medical Assistant Filed: 09/10/2016 11:28 AM  Note Status: Signed Cosign: Cosign Not Required Encounter Date: 09/05/2016 8:20 AM  Editor: Delanna NoticeAmber Nichole Parrish, CMA (Certified Medical Assistant)    She never had the labs done. I called patient twice today and on 09/05/2016 to get a better understanding. No luck left patient message both times. If I do not get a call back I will mail out a letter.    Progress Notes by Kerman PasseyMelinda P Amarys Sliwinski, MD at 09/05/2016 8:20 AM   Author: Kerman PasseyMelinda P Anjanae Woehrle, MD Author Type: Physician Filed: 09/05/2016 8:21 AM  Note Status: Signed Cosign: Cosign Not Required Encounter Date: 09/05/2016 8:20 AM  Editor: Kerman PasseyMelinda P Jeff Mccallum, MD (Physician)    Labs were ordered on 08/28/16 for thyroid studies; I do not have those results back Please check on those, resolve; thank you     Please check on this again I do not see the letter Dr. Sherie DonLada

## 2016-10-12 ENCOUNTER — Encounter: Payer: Self-pay | Admitting: Family Medicine

## 2016-10-15 ENCOUNTER — Ambulatory Visit: Payer: Self-pay | Admitting: Family Medicine

## 2016-10-19 ENCOUNTER — Other Ambulatory Visit: Payer: Self-pay | Admitting: Family Medicine

## 2016-10-19 MED ORDER — ALPRAZOLAM 0.5 MG PO TABS: 0.2500 mg | ORAL_TABLET | Freq: Every day | ORAL | 0 refills | Status: DC | PRN

## 2016-10-19 NOTE — Progress Notes (Signed)
[place] web site reviewed; no inappropriate prescribers Refill approved for very limited amount

## 2016-11-26 ENCOUNTER — Telehealth: Payer: Self-pay | Admitting: Family Medicine

## 2016-11-26 NOTE — Telephone Encounter (Signed)
Please follow-up on the thyroid labs again; thank you

## 2016-11-27 ENCOUNTER — Ambulatory Visit: Payer: 59 | Admitting: Family Medicine

## 2016-11-30 NOTE — Telephone Encounter (Signed)
Tried reaching out to the patient. Left a voicemail as well sent out a letter in Sep 24 2016. I will sent out another letter to remind the pt about labs.

## 2016-12-08 ENCOUNTER — Telehealth: Payer: Self-pay | Admitting: Family Medicine

## 2016-12-08 ENCOUNTER — Other Ambulatory Visit: Payer: Self-pay

## 2016-12-08 DIAGNOSIS — E039 Hypothyroidism, unspecified: Secondary | ICD-10-CM

## 2016-12-08 LAB — T4, FREE: Free T4: 1.7 ng/dL (ref 0.8–1.8)

## 2016-12-08 LAB — TSH: TSH: 0.08 m[IU]/L — AB

## 2016-12-08 MED ORDER — LEVOTHYROXINE SODIUM 150 MCG PO TABS: 150.0000 ug | ORAL_TABLET | Freq: Every day | ORAL | 1 refills | Status: DC

## 2016-12-08 NOTE — Telephone Encounter (Signed)
I talked to patient, thanked her for coming in to get her labs done She does not plan on going back to see endo and asked if we could adjust her medicine Decrease dose to 150 mcg; recheck TSH in about 6-8 weeks She is asymptomatic, denies anxiety, palpitations She agrees with plan

## 2016-12-11 ENCOUNTER — Encounter: Payer: Self-pay | Admitting: Family Medicine

## 2016-12-11 ENCOUNTER — Ambulatory Visit (INDEPENDENT_AMBULATORY_CARE_PROVIDER_SITE_OTHER): Payer: 59 | Admitting: Family Medicine

## 2016-12-11 VITALS — BP 110/62 | HR 89 | Temp 98.3°F | Resp 14 | Wt 157.8 lb

## 2016-12-11 DIAGNOSIS — F418 Other specified anxiety disorders: Secondary | ICD-10-CM | POA: Diagnosis not present

## 2016-12-11 DIAGNOSIS — E039 Hypothyroidism, unspecified: Secondary | ICD-10-CM | POA: Diagnosis not present

## 2016-12-11 DIAGNOSIS — F410 Panic disorder [episodic paroxysmal anxiety] without agoraphobia: Secondary | ICD-10-CM

## 2016-12-11 DIAGNOSIS — Z1239 Encounter for other screening for malignant neoplasm of breast: Secondary | ICD-10-CM

## 2016-12-11 DIAGNOSIS — Z1231 Encounter for screening mammogram for malignant neoplasm of breast: Secondary | ICD-10-CM | POA: Diagnosis not present

## 2016-12-11 NOTE — Assessment & Plan Note (Signed)
Reviewed abnormal thyroid labs with her; dose recently adjusted with close f/u; she denies symptoms of over-replacement

## 2016-12-11 NOTE — Assessment & Plan Note (Signed)
Using alprazolam very sparingly, now with travel-induced anxiety; discussed the risk of concomittant mixing of benzos with narcotics, alcohol, sedative hypnotics, and cough syrups; gave her a copy of the FDA press release from Aug 2016; I am willing to prescribe refill in between now and her next appt of benzo if needed

## 2016-12-11 NOTE — Patient Instructions (Signed)
Never ever mix alprazolam with other anxiety medicine, sleeping pills, narcotic pain medicine, or prescription cough syrup

## 2016-12-11 NOTE — Progress Notes (Signed)
BP 110/62   Pulse 89   Temp 98.3 F (36.8 C) (Oral)   Resp 14   Wt 157 lb 12.8 oz (71.6 kg)   LMP 12/10/2016   SpO2 98%   BMI 28.86 kg/m    Subjective:    Patient ID: Sheri BandaAlexa R Burdine, female    DOB: Mar 22, 1976, 41 y.o.   MRN: [id number]  HPI: Sheri Bandalexa R Reinders is a 41 y.o. female  Chief Complaint  Patient presents with  . Follow-up    HPI She is on alprazolam; only using it when she really really needs it; uses it when flying; does need it then She traveled in April twice and has some left over from last fill Knows to not mix with alcohol  Taking sertraline for anxiety; doing well; wants to stay on current dose; has tried to come off of it before and gets anxious and short-tempered  Thyroid dose was too much; not symptomatic at all; no weight loss, not jittery No known thyroid disease in the fmaily at all; no other autoimmune disease Mother and grandmother with OA Lab Results  Component Value Date   TSH 0.08 (L) 12/08/2016    [facility] 12/11/2016 08/28/2016 05/28/2016 02/24/2016 01/03/2016  Decreased Interest 0 0 0 0 0  Down, Depressed, Hopeless 0 0 0 0 0  PHQ - 2 Score 0 0 0 0 0    Relevant past medical, surgical, family and social history reviewed Past Medical History:  Diagnosis Date  . Anxiety   . Chronic anxiety 01/03/2016  . Hypothyroidism, adult 04/09/2015  . Panic disorder without agoraphobia 01/03/2016   Past Surgical History:  Procedure Laterality Date  . CHOLECYSTECTOMY  2004  . LAPAROSCOPIC GASTRIC SLEEVE RESECTION  2014   Family History  Problem Relation Age of Onset  . Heart disease Mother   . Hyperlipidemia Mother   . Hypertension Mother   . Breast cancer Mother [id number]  . Hearing loss Father        due to age  . Arthritis Maternal Grandmother   . Hyperlipidemia Maternal Grandmother   . Hypertension Maternal Grandmother   . Heart disease Maternal Grandfather   . Hyperlipidemia Maternal Grandfather   . Hypertension Maternal Grandfather     . Stroke Paternal Grandmother   . Heart disease Paternal Grandfather   . Depression Maternal Aunt   . Hyperlipidemia Maternal Aunt   . Hypertension Maternal Aunt   . Breast cancer Maternal Aunt        2350's   Social History   Social History  . Marital status: Married    Spouse name: N/A  . Number of children: N/A  . Years of education: N/A   Occupational History  . Not on file.   Social History Main Topics  . Smoking status: Never Smoker  . Smokeless tobacco: Never Used  . Alcohol use 0.0 oz/week     Comment: occasionally  . Drug use: No  . Sexual activity: Yes    Partners: Male    Birth control/ protection: None   Other Topics Concern  . Not on file   Social History Narrative  . No narrative on file   Interim medical history since last visit reviewed. Allergies and medications reviewed  Review of Systems Per HPI unless specifically indicated above     Objective:    BP 110/62   Pulse 89   Temp 98.3 F (36.8 C) (Oral)   Resp 14   Wt 157 lb 12.8 oz (  71.6 kg)   LMP 12/10/2016   SpO2 98%   BMI 28.86 kg/m   Wt Readings from Last 3 Encounters:  12/11/16 157 lb 12.8 oz (71.6 kg)  08/28/16 160 lb 9 oz (72.8 kg)  05/28/16 155 lb 9.6 oz (70.6 kg)    Physical Exam  Constitutional: She appears well-developed and well-nourished.  HENT:  Mouth/Throat: Mucous membranes are normal.  Eyes: EOM are normal. No scleral icterus.  Neck: No thyromegaly present.  Cardiovascular: Normal rate and regular rhythm.   No extrasystoles are present.  Pulmonary/Chest: Effort normal and breath sounds normal.  Musculoskeletal: She exhibits no edema.  Neurological: She is alert.  Reflex Scores:      Patellar reflexes are 2+ on the right side and 2+ on the left side. Psychiatric: She has a normal mood and affect. Her behavior is normal. Her mood appears not anxious. She does not exhibit a depressed mood.   Results for orders placed or performed in visit on 12/08/16  TSH  Result  Value Ref Range   TSH 0.08 (L) mIU/L  T4, free  Result Value Ref Range   Free T4 1.7 0.8 - 1.8 ng/dL      Assessment & Plan:   Problem List Items Addressed This Visit      Endocrine   Hypothyroidism, adult - Primary    Reviewed abnormal thyroid labs with her; dose recently adjusted with close f/u; she denies symptoms of over-replacement        Other   Panic disorder without agoraphobia    Using alprazolam very sparingly, now with travel-induced anxiety; discussed the risk of concomittant mixing of benzos with narcotics, alcohol, sedative hypnotics, and cough syrups; gave her a copy of the FDA press release from Aug 2016; I am willing to prescribe refill in between now and her next appt of benzo if needed      Depression with anxiety    Continue sertraline       Other Visit Diagnoses    Breast cancer screening       Relevant Orders   MM Digital Screening       Follow up plan: Return in about 3 months (around 03/13/2017) for follow-up visit with Dr. Sherie Don.  An after-visit summary was printed and given to the patient at check-out.  Please see the patient instructions which may contain other information and recommendations beyond what is mentioned above in the assessment and plan.  No orders of the defined types were placed in this encounter.   Orders Placed This Encounter  Procedures  . MM Digital Screening   Okay for refill of limited alprazolam when needed

## 2016-12-11 NOTE — Assessment & Plan Note (Signed)
Continue sertraline 

## 2016-12-26 ENCOUNTER — Other Ambulatory Visit: Payer: Self-pay | Admitting: Family Medicine

## 2016-12-29 ENCOUNTER — Other Ambulatory Visit: Payer: Self-pay | Admitting: Family Medicine

## 2016-12-29 MED ORDER — ALPRAZOLAM 0.5 MG PO TABS: 0.2500 mg | ORAL_TABLET | Freq: Every day | ORAL | 0 refills | Status: DC | PRN

## 2016-12-29 NOTE — Telephone Encounter (Signed)
Pt needs a refill on Alprazolam. Pt is going out of town this morning and pt is completely out of her medication. Please advise.

## 2016-12-29 NOTE — Telephone Encounter (Signed)
Reviewed [facility] web site rx ready to fax

## 2017-03-10 ENCOUNTER — Other Ambulatory Visit: Payer: Self-pay | Admitting: Family Medicine

## 2017-03-11 ENCOUNTER — Other Ambulatory Visit: Payer: Self-pay | Admitting: Family Medicine

## 2017-03-11 MED ORDER — ALPRAZOLAM 0.5 MG PO TABS: 0.2500 mg | ORAL_TABLET | Freq: Every day | ORAL | 0 refills | Status: DC | PRN

## 2017-03-11 NOTE — Progress Notes (Signed)
Reviewed [facility] web site; no red flags; okay for refill

## 2017-03-18 ENCOUNTER — Ambulatory Visit: Payer: 59 | Admitting: Family Medicine

## 2017-03-19 ENCOUNTER — Ambulatory Visit: Payer: 59 | Admitting: Family Medicine

## 2017-03-19 ENCOUNTER — Encounter: Payer: Self-pay | Admitting: Family Medicine

## 2017-03-19 NOTE — Telephone Encounter (Signed)
Routing to appropriate staff Her thyroid was due June 20th; sooner she gets it, the better please

## 2017-03-23 ENCOUNTER — Encounter: Payer: Self-pay | Admitting: Family Medicine

## 2017-03-23 ENCOUNTER — Ambulatory Visit (INDEPENDENT_AMBULATORY_CARE_PROVIDER_SITE_OTHER): Payer: 59 | Admitting: Family Medicine

## 2017-03-23 VITALS — BP 124/76 | HR 92 | Temp 98.0°F | Resp 14 | Wt 162.5 lb

## 2017-03-23 DIAGNOSIS — R768 Other specified abnormal immunological findings in serum: Secondary | ICD-10-CM

## 2017-03-23 DIAGNOSIS — R5383 Other fatigue: Secondary | ICD-10-CM | POA: Diagnosis not present

## 2017-03-23 DIAGNOSIS — D509 Iron deficiency anemia, unspecified: Secondary | ICD-10-CM | POA: Insufficient documentation

## 2017-03-23 DIAGNOSIS — Z0000 Encounter for general adult medical examination without abnormal findings: Secondary | ICD-10-CM

## 2017-03-23 DIAGNOSIS — Z862 Personal history of diseases of the blood and blood-forming organs and certain disorders involving the immune mechanism: Secondary | ICD-10-CM | POA: Diagnosis not present

## 2017-03-23 DIAGNOSIS — E039 Hypothyroidism, unspecified: Secondary | ICD-10-CM

## 2017-03-23 LAB — CBC WITH DIFFERENTIAL/PLATELET
BASOS PCT: 1 %
Basophils Absolute: 56 cells/uL (ref 0–200)
Eosinophils Absolute: 336 cells/uL (ref 15–500)
Eosinophils Relative: 6 %
HCT: 30.8 % — ABNORMAL LOW (ref 35.0–45.0)
Hemoglobin: 9.7 g/dL — ABNORMAL LOW (ref 11.7–15.5)
LYMPHS PCT: 26 %
Lymphs Abs: 1456 cells/uL (ref 850–3900)
MCH: 24 pg — ABNORMAL LOW (ref 27.0–33.0)
MCHC: 31.5 g/dL — ABNORMAL LOW (ref 32.0–36.0)
MCV: 76 fL — ABNORMAL LOW (ref 80.0–100.0)
MONO ABS: 504 {cells}/uL (ref 200–950)
MONOS PCT: 9 %
MPV: 8.4 fL (ref 7.5–12.5)
Neutro Abs: 3248 cells/uL (ref 1500–7800)
Neutrophils Relative %: 58 %
PLATELETS: 433 10*3/uL — AB (ref 140–400)
RBC: 4.05 MIL/uL (ref 3.80–5.10)
RDW: 16.5 % — AB (ref 11.0–15.0)
WBC: 5.6 10*3/uL (ref 3.8–10.8)

## 2017-03-23 LAB — COMPLETE METABOLIC PANEL WITH GFR
ALT: 15 U/L (ref 6–29)
AST: 18 U/L (ref 10–30)
Albumin: 4.3 g/dL (ref 3.6–5.1)
Alkaline Phosphatase: 63 U/L (ref 33–115)
BUN: 7 mg/dL (ref 7–25)
CHLORIDE: 104 mmol/L (ref 98–110)
CO2: 25 mmol/L (ref 20–32)
CREATININE: 0.7 mg/dL (ref 0.50–1.10)
Calcium: 9.4 mg/dL (ref 8.6–10.2)
GFR, Est African American: >89 mL/min (ref >=60)
GFR, Est Non African American: >89 mL/min (ref >=60)
Glucose, Bld: 86 mg/dL (ref 65–99)
Potassium: 4 mmol/L (ref 3.5–5.3)
Sodium: 138 mmol/L (ref 135–146)
Total Bilirubin: 0.5 mg/dL (ref 0.2–1.2)
Total Protein: 7 g/dL (ref 6.1–8.1)

## 2017-03-23 LAB — LIPID PANEL
Cholesterol: 203 mg/dL — ABNORMAL HIGH (ref <200)
HDL: 74 mg/dL (ref >50)
LDL CALC: 112 mg/dL — AB (ref <100)
TRIGLYCERIDES: 83 mg/dL (ref <150)
Total CHOL/HDL Ratio: 2.7 Ratio (ref <5.0)
VLDL: 17 mg/dL (ref <30)

## 2017-03-23 LAB — TSH: TSH: 0.28 mIU/L — ABNORMAL LOW

## 2017-03-23 LAB — T4, FREE: FREE T4: 1.9 ng/dL — AB (ref 0.8–1.8)

## 2017-03-23 NOTE — Patient Instructions (Signed)
Let's get labs today If you have not heard anything from my staff in a week about any orders/referrals/studies from today, please contact us here to follow-up [phone] Return for a complete physical in the next month or two

## 2017-03-23 NOTE — Assessment & Plan Note (Signed)
Check TSH and free T4 

## 2017-03-23 NOTE — Assessment & Plan Note (Signed)
Check CBC and ferritin 

## 2017-03-23 NOTE — Assessment & Plan Note (Signed)
Patient will return for physical and will just draw CPE labs today

## 2017-03-23 NOTE — Assessment & Plan Note (Signed)
Check antibodies 

## 2017-03-23 NOTE — Progress Notes (Signed)
BP 124/76   Pulse 92   Temp 98 F (36.7 C) (Oral)   Resp 14   Wt 162 lb 8 oz (73.7 kg)   LMP 03/10/2017   SpO2 97%   BMI 29.72 kg/m    Subjective:    Patient ID: Sheri Garrison, female    DOB: Apr 09, 1976, 41 y.o.   MRN: [id number]  HPI: Sheri Garrison is a 41 y.o. female  Chief Complaint  Patient presents with  . Follow-up  . Hypothyroidism  . Fatigue    low energy level    HPI She had her thyroid medicine changed a few months ago; had been on 175 mcg for at least a year No one else in the family (immediately) has thyroid trouble, but she will ask She has been feeling tired since the change She went and got some iron supplements, just started that; has been borderline anemic in the past Her lips stay chapped all the time; would like iron checked No constipation No change in sleep habits; feels like she could sleep all the time; goes to bed a little later and up a little later because of summer, not having to get kids up for school; getting ready to change back Gets plenty of sunshine, especially during the weekends Having some dry skin, thought maybe from sun; hair loss happened in her 20's, ntohing recently Gained about 5 pounds, but not eating as well as she should Not feeling depressed  Depression screen [facility] 2/9 03/23/2017 12/11/2016 08/28/2016 05/28/2016 02/24/2016  Decreased Interest 0 0 0 0 0  Down, Depressed, Hopeless 0 0 0 0 0  PHQ - 2 Score 0 0 0 0 0    Relevant past medical, surgical, family and social history reviewed Past Medical History:  Diagnosis Date  . Anxiety   . Chronic anxiety 01/03/2016  . Hypothyroidism, adult 04/09/2015  . Panic disorder without agoraphobia 01/03/2016   Past Surgical History:  Procedure Laterality Date  . CHOLECYSTECTOMY  2004  . LAPAROSCOPIC GASTRIC SLEEVE RESECTION  2014   Family History  Problem Relation Age of Onset  . Heart disease Mother   . Hyperlipidemia Mother   . Hypertension Mother   . Breast cancer Mother 35  .  Hearing loss Father        due to age  . Arthritis Maternal Grandmother   . Hyperlipidemia Maternal Grandmother   . Hypertension Maternal Grandmother   . Heart disease Maternal Grandfather   . Hyperlipidemia Maternal Grandfather   . Hypertension Maternal Grandfather   . Stroke Paternal Grandmother   . Heart disease Paternal Grandfather   . Depression Maternal Aunt   . Hyperlipidemia Maternal Aunt   . Hypertension Maternal Aunt   . Breast cancer Maternal Aunt        1's   Social History   Social History  . Marital status: Married    Spouse name: N/A  . Number of children: N/A  . Years of education: N/A   Occupational History  . Not on file.   Social History Main Topics  . Smoking status: Never Smoker  . Smokeless tobacco: Never Used  . Alcohol use 0.0 oz/week     Comment: occasionally  . Drug use: No  . Sexual activity: Yes    Partners: Male    Birth control/ protection: None   Other Topics Concern  . Not on file   Social History Narrative  . No narrative on file    Interim medical history since  last visit reviewed. Allergies and medications reviewed  Review of Systems Per HPI unless specifically indicated above     Objective:    BP 124/76   Pulse 92   Temp 98 F (36.7 C) (Oral)   Resp 14   Wt 162 lb 8 oz (73.7 kg)   LMP 03/10/2017   SpO2 97%   BMI 29.72 kg/m   Wt Readings from Last 3 Encounters:  03/23/17 162 lb 8 oz (73.7 kg)  12/11/16 157 lb 12.8 oz (71.6 kg)  08/28/16 160 lb 9 oz (72.8 kg)    Physical Exam  Constitutional: She appears well-developed and well-nourished.  HENT:  Mouth/Throat: Mucous membranes are normal.  Eyes: EOM are normal. No scleral icterus.  Neck: No thyromegaly present.  Cardiovascular: Normal rate and regular rhythm.   No extrasystoles are present.  Pulmonary/Chest: Effort normal and breath sounds normal.  Musculoskeletal: She exhibits no edema.  Neurological: She is alert. She displays no tremor.  Reflex  Scores:      Patellar reflexes are 2+ on the right side and 2+ on the left side. Psychiatric: She has a normal mood and affect. Her behavior is normal. Her mood appears not anxious. She does not exhibit a depressed mood.    Results for orders placed or performed in visit on 12/08/16  TSH  Result Value Ref Range   TSH 0.08 (L) mIU/L  T4, free  Result Value Ref Range   Free T4 1.7 0.8 - 1.8 ng/dL      Assessment & Plan:   Problem List Items Addressed This Visit      Endocrine   Hypothyroidism, adult - Primary    Check TSH and free T4      Relevant Orders   TSH   T4, free   Thyroid Peroxidase Antibody     Other   Hx of iron deficiency anemia    Check CBC and ferritin      Relevant Orders   Ferritin   Anti-TPO antibodies present    Check antibodies      Relevant Orders   TSH   T4, free   Thyroid Peroxidase Antibody   Annual physical exam    Patient will return for physical and will just draw CPE labs today      Relevant Orders   CBC with Differential/Platelet   COMPLETE METABOLIC PANEL WITH GFR   Lipid panel    Other Visit Diagnoses    Other fatigue       Relevant Orders   VITAMIN D 25 Hydroxy (Vit-D Deficiency, Fractures)       Follow up plan: Return in about 2 months (around 05/23/2017) for complete physical.  An after-visit summary was printed and given to the patient at check-out.  Please see the patient instructions which may contain other information and recommendations beyond what is mentioned above in the assessment and plan.  No orders of the defined types were placed in this encounter.   Orders Placed This Encounter  Procedures  . CBC with Differential/Platelet  . COMPLETE METABOLIC PANEL WITH GFR  . Lipid panel  . VITAMIN D 25 Hydroxy (Vit-D Deficiency, Fractures)  . TSH  . T4, free  . Thyroid Peroxidase Antibody  . Ferritin

## 2017-03-24 LAB — THYROID PEROXIDASE ANTIBODY: THYROID PEROXIDASE ANTIBODY: 225 [IU]/mL — AB (ref <9)

## 2017-03-24 LAB — VITAMIN D 25 HYDROXY (VIT D DEFICIENCY, FRACTURES): Vit D, 25-Hydroxy: 28 ng/mL — ABNORMAL LOW (ref 30–100)

## 2017-03-24 LAB — FERRITIN: Ferritin: 8 ng/mL — ABNORMAL LOW (ref 10–232)

## 2017-03-26 ENCOUNTER — Encounter: Payer: Self-pay | Admitting: Family Medicine

## 2017-03-26 ENCOUNTER — Other Ambulatory Visit: Payer: Self-pay | Admitting: Family Medicine

## 2017-03-26 DIAGNOSIS — E039 Hypothyroidism, unspecified: Secondary | ICD-10-CM

## 2017-03-26 MED ORDER — LEVOTHYROXINE SODIUM 137 MCG PO TABS: 137.0000 ug | ORAL_TABLET | Freq: Every day | ORAL | 1 refills | Status: DC

## 2017-03-26 NOTE — Progress Notes (Signed)
She is anemic; not having heavy periods; has been borderline before I would like her to do stool cards; explained procedure; set marked and set aside for her to pick-up; she'll come Monday Not a lot of iron in her diet; does eat a little red meat; not many greens; start iron after she completes the stools, start iron pill once a day, then recheck labs in 6-8 weeks Adjust thyroid dose; offered referral back to endocrinologist; she is happy to just adjust dose here; adjust dose and recheck labs in 6-8 weeks

## 2017-03-26 NOTE — Telephone Encounter (Signed)
We had a lot to discuss, so I called patient personally; see telephone note

## 2017-04-01 ENCOUNTER — Other Ambulatory Visit (INDEPENDENT_AMBULATORY_CARE_PROVIDER_SITE_OTHER): Payer: 59

## 2017-04-01 DIAGNOSIS — Z1211 Encounter for screening for malignant neoplasm of colon: Secondary | ICD-10-CM | POA: Diagnosis not present

## 2017-04-01 LAB — POC HEMOCCULT BLD/STL (HOME/3-CARD/SCREEN)
CARD #3 DATE: 8292018
Card #2 Date: 8282018
FECAL OCCULT BLD: NEGATIVE
FECAL OCCULT BLD: NEGATIVE
Fecal Occult Blood, POC: NEGATIVE
OCCULT BLOOD DATE: 8272018

## 2017-05-24 ENCOUNTER — Encounter: Payer: 59 | Admitting: Family Medicine

## 2017-06-30 ENCOUNTER — Telehealth: Payer: Self-pay | Admitting: Family Medicine

## 2017-06-30 NOTE — Telephone Encounter (Signed)
Please let pt know we'd like to bring her in to recheck her thyroid labs; those were due to be drawn around October 10th Thank you

## 2017-06-30 NOTE — Telephone Encounter (Signed)
Called pt no answer. Unable to leave message as voicemail is full. CRM created.  

## 2017-07-17 ENCOUNTER — Other Ambulatory Visit: Payer: Self-pay | Admitting: Family Medicine

## 2017-07-17 NOTE — Telephone Encounter (Signed)
Please see 06/30/17 phone note Patient needs labs I'll approve limited amount of medicine

## 2017-07-19 NOTE — Telephone Encounter (Signed)
Called pt no answer. LM for pt informing her of the need to come in and have labs drawn. Dr.Lada will prescribe short term refill. My chart message sent also CRM created.

## 2017-08-18 IMAGING — MG MM DIGITAL SCREENING BILAT W/ CAD
5 series · 5 of 5 positions shown · non-contrast
Comparison: 06/01/2011

CLINICAL DATA: Screening.

EXAM:
DIGITAL SCREENING BILATERAL MAMMOGRAM WITH CAD

[R CC]
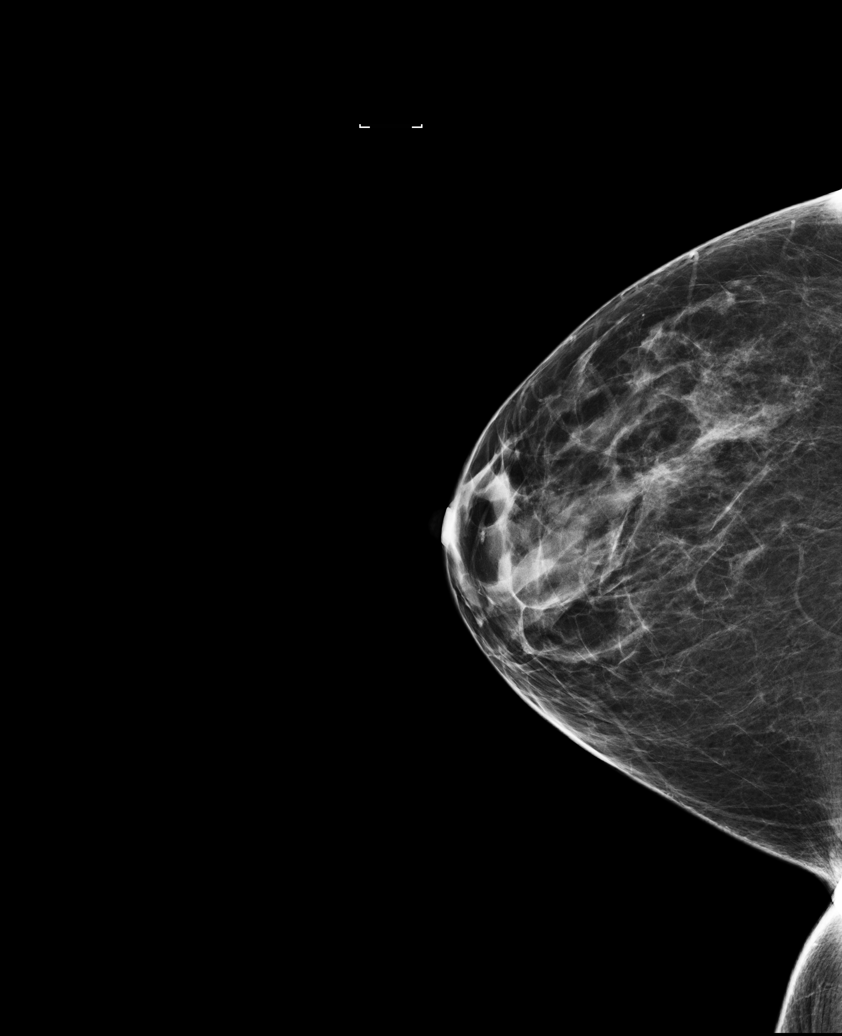

[L CC]
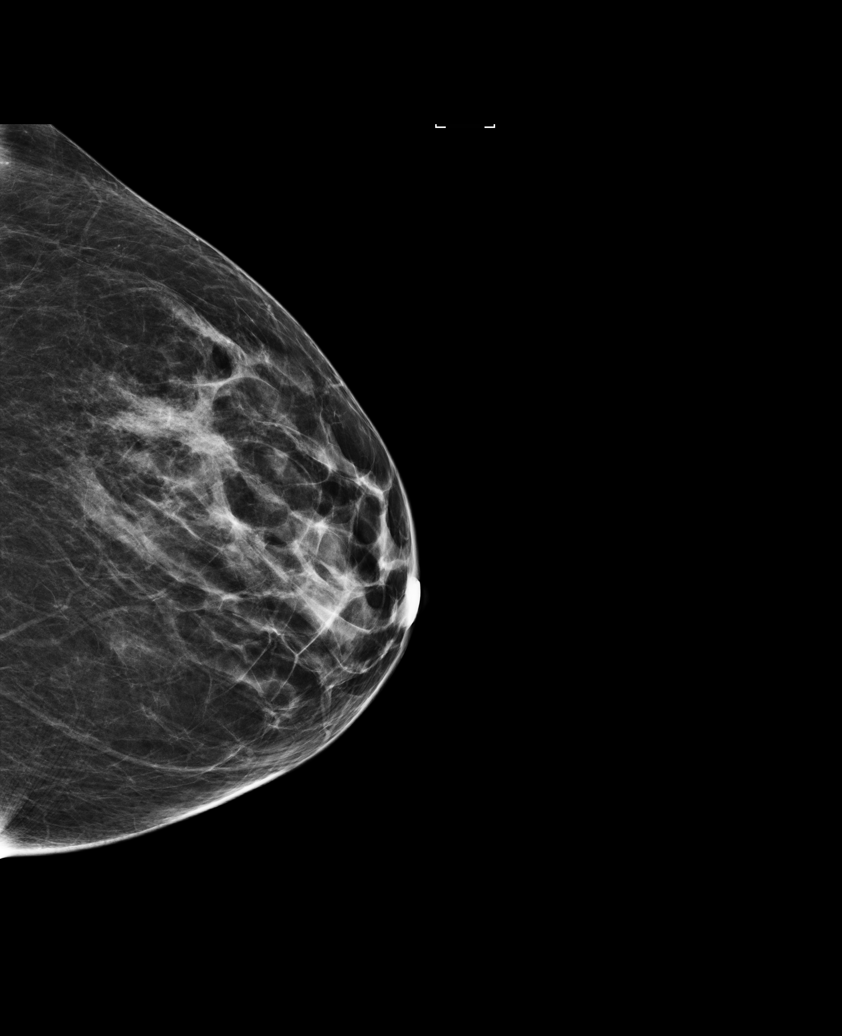

[R MLO (1 of 2)]
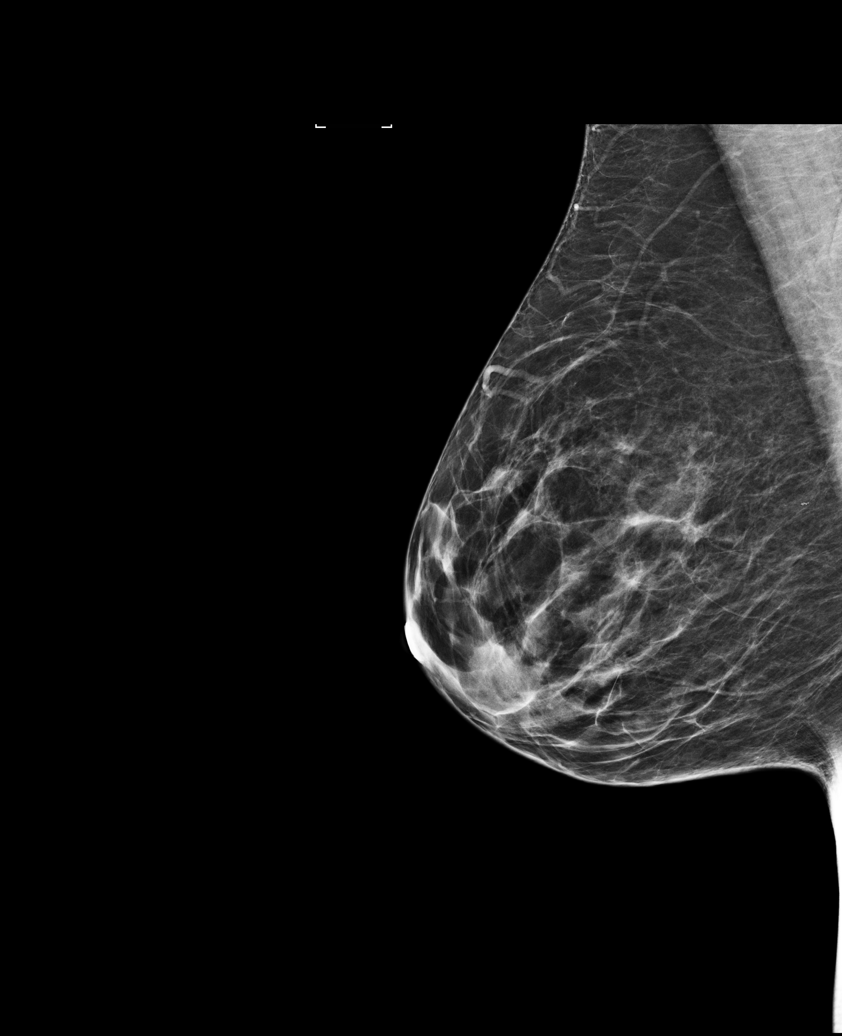

[L MLO]
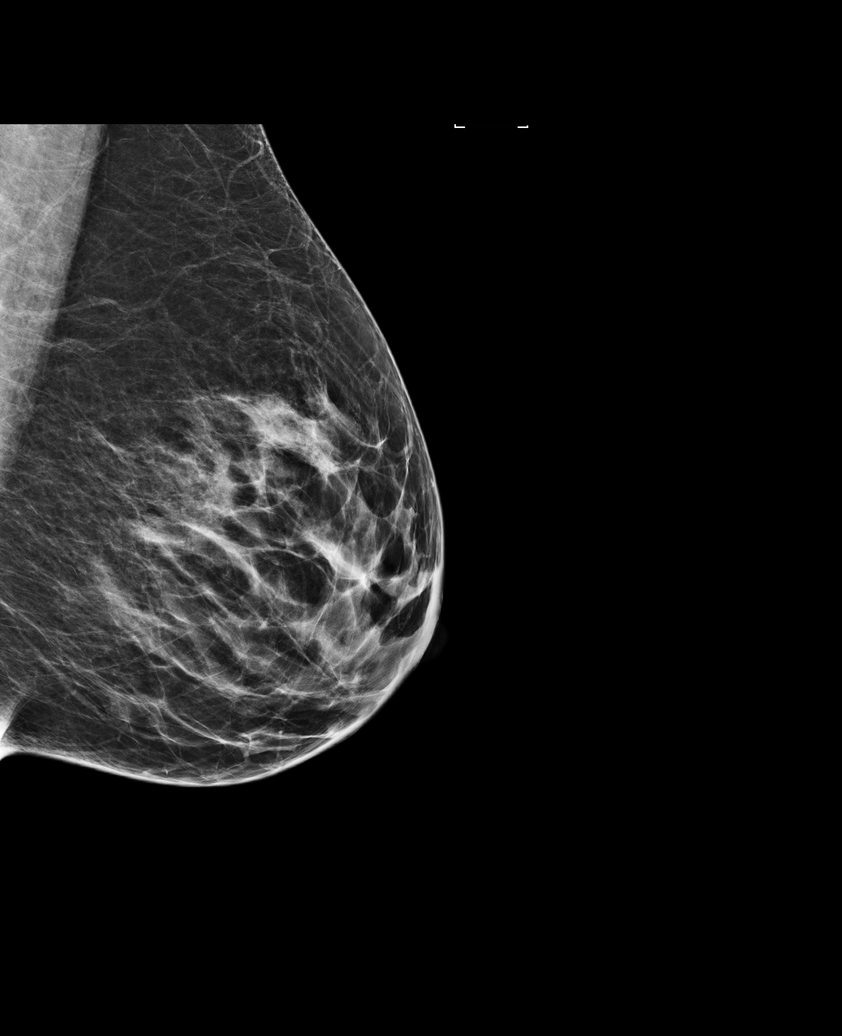

[R MLO (2 of 2)]
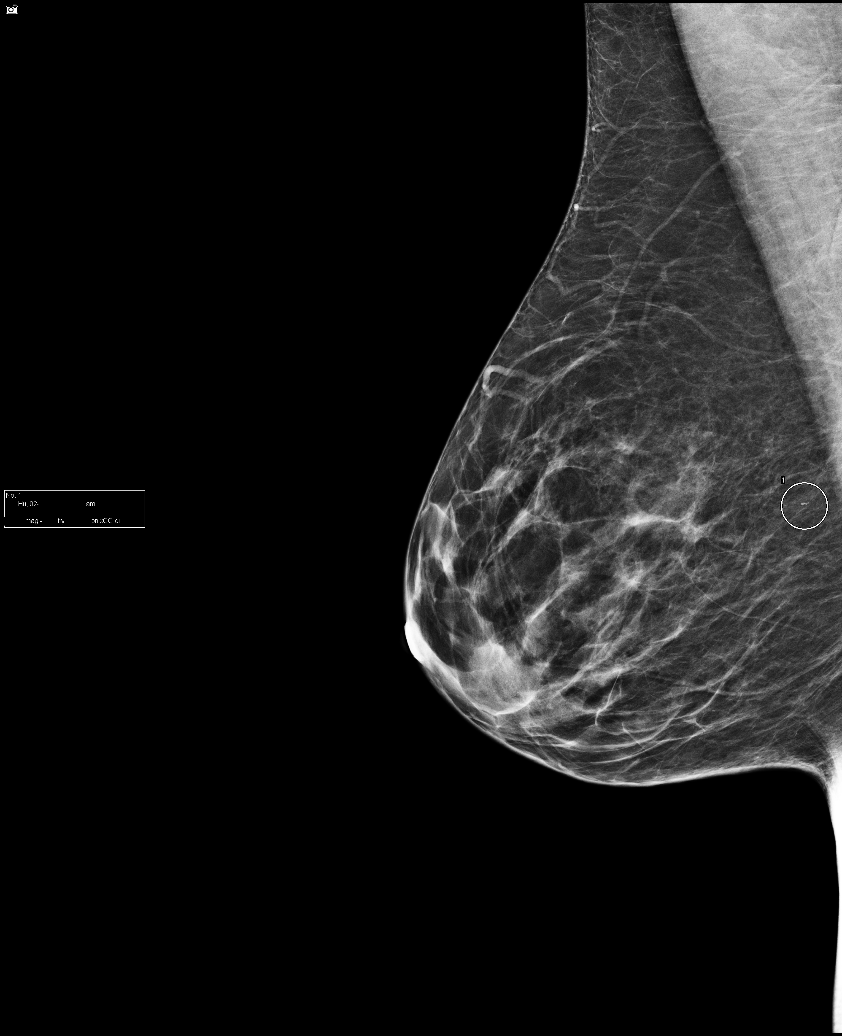

[5 of 5 positions shown; findings below may reference images not displayed]

ACR Breast Density Category b: There are scattered areas of
fibroglandular density.
FINDINGS: In the right breast, calcifications warrant further evaluation with
magnified views. In the left breast, no findings suspicious for
malignancy. Images were processed with CAD.
IMPRESSION: Further evaluation is suggested for calcifications in the right
breast.

RECOMMENDATION:
Diagnostic mammogram of the right breast. (Code:[id number])

The patient will be contacted regarding the findings, and additional
imaging will be scheduled.

BI-RADS CATEGORY  0: Incomplete. Need additional imaging evaluation
and/or prior mammograms for comparison.

## 2017-08-27 ENCOUNTER — Other Ambulatory Visit: Payer: Self-pay | Admitting: Family Medicine

## 2017-08-30 NOTE — Telephone Encounter (Signed)
Left detailed voicemail

## 2017-08-30 NOTE — Telephone Encounter (Signed)
Please remind patient again about outstanding labs that were due months ago Thank you

## 2017-10-08 ENCOUNTER — Other Ambulatory Visit: Payer: Self-pay

## 2017-10-08 DIAGNOSIS — E039 Hypothyroidism, unspecified: Secondary | ICD-10-CM

## 2017-10-08 DIAGNOSIS — D509 Iron deficiency anemia, unspecified: Secondary | ICD-10-CM

## 2017-10-09 LAB — CBC WITH DIFFERENTIAL/PLATELET
BASOS ABS: 58 {cells}/uL (ref 0–200)
Basophils Relative: 0.6 %
EOS ABS: 432 {cells}/uL (ref 15–500)
Eosinophils Relative: 4.5 %
HEMATOCRIT: 36.7 % (ref 35.0–45.0)
Hemoglobin: 12.5 g/dL (ref 11.7–15.5)
Lymphs Abs: 1757 cells/uL (ref 850–3900)
MCH: 29.4 pg (ref 27.0–33.0)
MCHC: 34.1 g/dL (ref 32.0–36.0)
MCV: 86.4 fL (ref 80.0–100.0)
MPV: 9.1 fL (ref 7.5–12.5)
Monocytes Relative: 4.6 %
NEUTROS PCT: 72 %
Neutro Abs: 6912 cells/uL (ref 1500–7800)
PLATELETS: 408 10*3/uL — AB (ref 140–400)
RBC: 4.25 10*6/uL (ref 3.80–5.10)
RDW: 12.6 % (ref 11.0–15.0)
TOTAL LYMPHOCYTE: 18.3 %
WBC mixed population: 442 cells/uL (ref 200–950)
WBC: 9.6 10*3/uL (ref 3.8–10.8)

## 2017-10-09 LAB — FERRITIN: Ferritin: 15 ng/mL (ref 10–232)

## 2017-10-09 LAB — TSH: TSH: 25.15 m[IU]/L — AB

## 2017-10-09 LAB — T4: T4, Total: 7.3 ug/dL (ref 5.1–11.9)

## 2017-10-11 ENCOUNTER — Other Ambulatory Visit: Payer: Self-pay | Admitting: Family Medicine

## 2017-10-11 DIAGNOSIS — E039 Hypothyroidism, unspecified: Secondary | ICD-10-CM

## 2017-10-11 MED ORDER — LEVOTHYROXINE SODIUM 50 MCG PO TABS: ORAL_TABLET | ORAL | 0 refills | Status: DC

## 2017-10-11 NOTE — Progress Notes (Signed)
Tapering back up by 25 mcg each week to target of 125 mcg Will send reminder flag to send 125 mcg strength in 4 weeks Check TSH and free T4 6 weeks after stable on 125 mcg Note to patient through MyChart

## 2017-10-15 ENCOUNTER — Other Ambulatory Visit: Payer: Self-pay | Admitting: Family Medicine

## 2017-10-18 ENCOUNTER — Other Ambulatory Visit: Payer: Self-pay | Admitting: Family Medicine

## 2017-10-21 ENCOUNTER — Encounter: Payer: Self-pay | Admitting: Family Medicine

## 2017-11-06 ENCOUNTER — Other Ambulatory Visit: Payer: Self-pay | Admitting: Family Medicine

## 2017-11-08 ENCOUNTER — Other Ambulatory Visit: Payer: Self-pay

## 2017-11-08 DIAGNOSIS — E039 Hypothyroidism, unspecified: Secondary | ICD-10-CM

## 2017-11-08 MED ORDER — LEVOTHYROXINE SODIUM 125 MCG PO TABS: 125.0000 ug | ORAL_TABLET | Freq: Every day | ORAL | 1 refills | Status: DC

## 2017-11-08 NOTE — Telephone Encounter (Signed)
She has been tapered back up; will be on 125 mcg daily now for 6 weeks, then check TSH and free T4 on that dose Adjust if needed

## 2017-11-09 ENCOUNTER — Encounter: Payer: Self-pay | Admitting: Family Medicine

## 2017-11-09 ENCOUNTER — Telehealth: Payer: Self-pay | Admitting: Family Medicine

## 2017-11-09 LAB — TSH: TSH: 4.6 m[IU]/L — AB

## 2017-11-09 LAB — T4, FREE: Free T4: 1.4 ng/dL (ref 0.8–1.8)

## 2017-11-09 NOTE — Telephone Encounter (Signed)
Patient had her labs drawn too early See last lab note: "Once you've been on 125 mcg daily for 6 weeks, please have your labs rechecked" Please put in new orders and ask her to return for labs 6 weeks after she has been on 125 mcg daily Thank you

## 2017-11-09 NOTE — Telephone Encounter (Signed)
Pt informed via mychart

## 2017-11-11 ENCOUNTER — Encounter: Payer: Self-pay | Admitting: Family Medicine

## 2017-11-11 ENCOUNTER — Other Ambulatory Visit: Payer: Self-pay | Admitting: Family Medicine

## 2017-11-22 ENCOUNTER — Other Ambulatory Visit: Payer: Self-pay | Admitting: Family Medicine

## 2018-01-10 ENCOUNTER — Other Ambulatory Visit: Payer: Self-pay | Admitting: Family Medicine

## 2018-01-10 DIAGNOSIS — E039 Hypothyroidism, unspecified: Secondary | ICD-10-CM

## 2018-01-10 NOTE — Telephone Encounter (Signed)
Please ask patient to have TSH done this week (ordered) She had the last one drawn too soon We need to see what the level is after a steady dose for 6 weeks Thank you

## 2018-01-10 NOTE — Telephone Encounter (Signed)
Left voice mail

## 2018-01-22 ENCOUNTER — Other Ambulatory Visit: Payer: Self-pay | Admitting: Family Medicine

## 2018-01-23 NOTE — Telephone Encounter (Signed)
Please remind patient that she is due for her thyroid test; see previous phone note if she has any questions She's also going to need a visit around August 21st; last visit was March 23, 2017

## 2018-01-24 ENCOUNTER — Other Ambulatory Visit: Payer: Self-pay

## 2018-01-24 DIAGNOSIS — E039 Hypothyroidism, unspecified: Secondary | ICD-10-CM

## 2018-01-24 NOTE — Telephone Encounter (Signed)
Pt notified and scheduled.

## 2018-01-26 LAB — TSH: TSH: 20.25 mIU/L — ABNORMAL HIGH

## 2018-01-27 ENCOUNTER — Other Ambulatory Visit: Payer: Self-pay | Admitting: Family Medicine

## 2018-01-27 DIAGNOSIS — E039 Hypothyroidism, unspecified: Secondary | ICD-10-CM

## 2018-01-27 MED ORDER — LEVOTHYROXINE SODIUM 150 MCG PO TABS: 150.0000 ug | ORAL_TABLET | Freq: Every day | ORAL | 0 refills | Status: DC

## 2018-01-27 NOTE — Progress Notes (Signed)
Switch dose and recheck TSH in 6-8 weeks

## 2018-02-20 ENCOUNTER — Other Ambulatory Visit: Payer: Self-pay | Admitting: Family Medicine

## 2018-02-21 NOTE — Telephone Encounter (Signed)
appt in August

## 2018-02-28 ENCOUNTER — Other Ambulatory Visit: Payer: Self-pay | Admitting: Family Medicine

## 2018-02-28 DIAGNOSIS — Z1231 Encounter for screening mammogram for malignant neoplasm of breast: Secondary | ICD-10-CM

## 2018-03-21 ENCOUNTER — Ambulatory Visit
Admission: RE | Admit: 2018-03-21 | Discharge: 2018-03-21 | Disposition: A | Discharge status: Home / Self Care | Payer: 59 | Source: Ambulatory Visit | Attending: Family Medicine | Admitting: Family Medicine

## 2018-03-21 DIAGNOSIS — Z1231 Encounter for screening mammogram for malignant neoplasm of breast: Secondary | ICD-10-CM | POA: Diagnosis present

## 2018-03-25 ENCOUNTER — Ambulatory Visit: Payer: Self-pay | Admitting: Family Medicine

## 2018-04-25 ENCOUNTER — Telehealth: Payer: Self-pay | Admitting: Family Medicine

## 2018-04-25 NOTE — Telephone Encounter (Signed)
Patient is overdue for labs She has not been seen since May 2018 I really must ask her to schedule an appointment and have her labs done appt with me or NP please

## 2018-04-25 NOTE — Telephone Encounter (Signed)
Spoke with patient and she scheduled an appt with Lanora ManisElizabeth for this coming Friday 9.27.19

## 2018-04-26 ENCOUNTER — Other Ambulatory Visit: Payer: Self-pay | Admitting: Nurse Practitioner

## 2018-04-29 ENCOUNTER — Encounter: Payer: Self-pay | Admitting: Nurse Practitioner

## 2018-04-29 ENCOUNTER — Ambulatory Visit (INDEPENDENT_AMBULATORY_CARE_PROVIDER_SITE_OTHER): Payer: 59 | Admitting: Nurse Practitioner

## 2018-04-29 VITALS — BP 110/70 | HR 82 | Temp 98.0°F | Resp 12 | Ht 62.0 in | Wt 163.4 lb

## 2018-04-29 DIAGNOSIS — R5383 Other fatigue: Secondary | ICD-10-CM | POA: Diagnosis not present

## 2018-04-29 DIAGNOSIS — E039 Hypothyroidism, unspecified: Secondary | ICD-10-CM

## 2018-04-29 DIAGNOSIS — D509 Iron deficiency anemia, unspecified: Secondary | ICD-10-CM

## 2018-04-29 DIAGNOSIS — Z23 Encounter for immunization: Secondary | ICD-10-CM

## 2018-04-29 DIAGNOSIS — F418 Other specified anxiety disorders: Secondary | ICD-10-CM

## 2018-04-29 DIAGNOSIS — Z114 Encounter for screening for human immunodeficiency virus [HIV]: Secondary | ICD-10-CM

## 2018-04-29 DIAGNOSIS — E559 Vitamin D deficiency, unspecified: Secondary | ICD-10-CM

## 2018-04-29 MED ORDER — SERTRALINE HCL 50 MG PO TABS: 50.0000 mg | ORAL_TABLET | Freq: Every day | ORAL | 1 refills | Status: DC

## 2018-04-29 MED ORDER — BUPROPION HCL ER (XL) 300 MG PO TB24: 300.0000 mg | ORAL_TABLET | Freq: Every day | ORAL | 1 refills | Status: DC

## 2018-04-29 NOTE — Patient Instructions (Addendum)
-  I'll encourage you to take your medicine every single morning at the same time, as soon as you get up, with a full glass of water. Do not eat or drink anything else including vitamins for at least 30 minutes. That will help with consistent absorption. Let's do that and increase your dose, then have you return in 6-8 weeks for a repeat.   - Drink at least 64 ounces of water - avoid caffeine after noon.

## 2018-04-29 NOTE — Progress Notes (Signed)
Name: Sheri Garrison   MRN: [id number]    DOB: Mar 19, 1976   Date:04/29/2018       Progress Note  Subjective  Chief Complaint  Chief Complaint  Patient presents with  . Medication Refill    HPI  Patient presents for routine follow-up to get medication refills.  Hypothyroidism Last tsh was very elevated and dose was increased to 150 mcg daily. Takes it first thing the morning with a full glass of water, has been taking it with wellbutrin. No missed doses.  Endorses fatigue, some constipation, dry hair, brittle nails- keeps acrylic, sleepy all the time, frequent naps and no energy  Denies cold intolerance, heat intolerance, palpitations, diarrhea.   Lab Results  Component Value Date   TSH 20.25 (H) 01/26/2018    Depression & Anxiety Takes wellbutrin and zoloft- works well for her. No missed doses has been on it for a few years. States when she started wellbutrin noticed she has more motivation but feels it is not working as well as when she started taking it. Occasionally takes the second dose around noon but sometimes doesn't take it because if she takes it later it keeps her up at night.  Depression screen [facility] 2/9 04/29/2018 03/23/2017 12/11/2016  Decreased Interest 1 0 0  Down, Depressed, Hopeless 0 0 0  PHQ - 2 Score 1 0 0  Altered sleeping 0 - -  Tired, decreased energy 3 - -  Change in appetite 0 - -  Feeling bad or failure about yourself  0 - -  Trouble concentrating 0 - -  Moving slowly or fidgety/restless 0 - -  Suicidal thoughts 0 - -  PHQ-9 Score 4 - -  Difficult doing work/chores Not difficult at all - -   GAD 7 : Generalized Anxiety Score 04/29/2018 05/28/2016 02/24/2016  Nervous, Anxious, on Edge 1 2 3   Control/stop worrying 0 1 1  Worry too much - different things 1 2 2   Trouble relaxing 1 3 3   Restless 0 2 1  Easily annoyed or irritable 2 2 2   Afraid - awful might happen 0 0 0  Total GAD 7 Score 5 12 12   Anxiety Difficulty - Somewhat difficult Somewhat  difficult      Patient Active Problem List   Diagnosis Date Noted  . Iron deficiency anemia 03/23/2017  . Depression with anxiety 06/04/2016  . Anti-TPO antibodies present 01/14/2016  . Controlled substance agreement signed 01/03/2016  . Chronic anxiety 01/03/2016  . Panic disorder without agoraphobia 01/03/2016  . Annual physical exam 07/30/2015  . Skin lesion of back 07/30/2015  . Hypothyroidism, adult 04/09/2015  . Encounter for cholesteral screening for cardiovascular disease 04/09/2015  . Encounter for screening mammogram for malignant neoplasm of breast 04/09/2015  . Heart palpitations 04/09/2015    Past Medical History:  Diagnosis Date  . Anxiety   . Chronic anxiety 01/03/2016  . Hypothyroidism, adult 04/09/2015  . Panic disorder without agoraphobia 01/03/2016    Past Surgical History:  Procedure Laterality Date  . CHOLECYSTECTOMY  2004  . LAPAROSCOPIC GASTRIC SLEEVE RESECTION  2014    Social History   Tobacco Use  . Smoking status: Never Smoker  . Smokeless tobacco: Never Used  Substance Use Topics  . Alcohol use: Yes    Alcohol/week: 0.0 standard drinks    Comment: occasionally     Current Outpatient Medications:  .  buPROPion (WELLBUTRIN SR) 150 MG 12 hr tablet, TAKE 1 TABLET BY MOUTH TWO  TIMES DAILY, Disp:  180 tablet, Rfl: 0 .  levothyroxine (SYNTHROID, LEVOTHROID) 150 MCG tablet, TAKE 1 TABLET BY MOUTH EVERY DAY, Disp: 5 tablet, Rfl: 0 .  Multiple Vitamin (MULTIVITAMIN) tablet, Take 1 tablet by mouth daily., Disp: , Rfl:  .  sertraline (ZOLOFT) 50 MG tablet, TAKE 1 TABLET BY MOUTH  DAILY, Disp: 90 tablet, Rfl: 0 .  ALPRAZolam (XANAX) 0.5 MG tablet, Take 0.5-1 tablets (0.25-0.5 mg total) by mouth daily as needed. (Patient not taking: Reported on 04/29/2018), Disp: 8 tablet, Rfl: 0  Allergies  Allergen Reactions  . Alka-Seltzer Plus Cold [Chlorphen-Phenyleph-Asa] Swelling  . Tessalon [Benzonatate] Hives and Swelling    ROS  No other specific  complaints in a complete review of systems (except as listed in HPI above).  Objective  Vitals:   04/29/18 0847  BP: 110/70  Pulse: 82  Resp: 12  Temp: 98 F (36.7 C)  TempSrc: Oral  SpO2: 99%  Weight: 163 lb 6.4 oz (74.1 kg)  Height: 5\' 2"  (1.575 m)    Body mass index is 29.89 kg/m.  Nursing Note and Vital Signs reviewed.  Physical Exam  Constitutional: She appears well-developed and well-nourished.  HENT:  Right Ear: Hearing normal.  Left Ear: Hearing normal.  Mouth/Throat: Uvula is midline and oropharynx is clear and moist.  Neck: Normal range of motion. Neck supple. Carotid bruit is not present. No thyroid mass and no thyromegaly present.  Cardiovascular: Normal heart sounds and intact distal pulses.  Pulmonary/Chest: Effort normal and breath sounds normal.  Abdominal: Soft. Bowel sounds are normal. There is no tenderness. There is no CVA tenderness.  Neurological: She is alert. She has normal strength. No sensory deficit. Coordination and gait normal. GCS eye subscore is 4. GCS verbal subscore is 5. GCS motor subscore is 6.  Skin: Skin is warm, dry and intact.  Psychiatric: She has a normal mood and affect. Her speech is normal and behavior is normal. Judgment normal.  Vitals reviewed.    No results found for this or any previous visit (from the past 48 hour(s)).  Assessment & Plan  1. Hypothyroidism, adult Will send refill based upon TSH level. Discussed taking it one hour before Wellbutrin.  - TSH  2. Iron deficiency anemia, unspecified iron deficiency anemia type - CBC  3. Depression with anxiety Feels wellbutrin is not working as well as it did before- is taking 150mg  daily instead of BID most of the time because she is busy at noon when she tries to take it and forgets- will try 300mg  extended release dose.  - buPROPion (WELLBUTRIN XL) 300 MG 24 hr tablet; Take 1 tablet (300 mg total) by mouth daily.  Dispense: 30 tablet; Refill: 1 - sertraline (ZOLOFT)  50 MG tablet; Take 1 tablet (50 mg total) by mouth daily.  Dispense: 90 tablet; Refill: 1 - COMPLETE METABOLIC PANEL WITH GFR  4. Other fatigue - discussed hydration.  - COMPLETE METABOLIC PANEL WITH GFR - Vitamin D (25 hydroxy)  5. Vitamin D deficiency - Vitamin D (25 hydroxy)  6. Flu vaccine need - Flu Vaccine QUAD 36+ mos IM  7. Screening for HIV (human immunodeficiency virus) - HIV antibody (with reflex)  8. Need for Tdap vaccination - Tdap vaccine greater than or equal to 7yo IM

## 2018-04-30 LAB — COMPLETE METABOLIC PANEL WITH GFR
AG Ratio: 1.7 (calc) (ref 1.0–2.5)
ALT: 14 U/L (ref 6–29)
AST: 19 U/L (ref 10–30)
Albumin: 4.2 g/dL (ref 3.6–5.1)
Alkaline phosphatase (APISO): 54 U/L (ref 33–115)
BUN: 13 mg/dL (ref 7–25)
CO2: 28 mmol/L (ref 20–32)
CREATININE: 0.69 mg/dL (ref 0.50–1.10)
Calcium: 9.5 mg/dL (ref 8.6–10.2)
Chloride: 103 mmol/L (ref 98–110)
GFR, Est African American: 123 mL/min/{1.73_m2} (ref >=60)
GFR, Est Non African American: 106 mL/min/{1.73_m2} (ref >=60)
GLUCOSE: 78 mg/dL (ref 65–139)
Globulin: 2.5 g/dL (calc) (ref 1.9–3.7)
Potassium: 4.1 mmol/L (ref 3.5–5.3)
Sodium: 137 mmol/L (ref 135–146)
Total Bilirubin: 0.7 mg/dL (ref 0.2–1.2)
Total Protein: 6.7 g/dL (ref 6.1–8.1)

## 2018-04-30 LAB — CBC
HCT: 37.9 % (ref 35.0–45.0)
Hemoglobin: 12.6 g/dL (ref 11.7–15.5)
MCH: 29.6 pg (ref 27.0–33.0)
MCHC: 33.2 g/dL (ref 32.0–36.0)
MCV: 89 fL (ref 80.0–100.0)
MPV: 9.4 fL (ref 7.5–12.5)
PLATELETS: 430 10*3/uL — AB (ref 140–400)
RBC: 4.26 10*6/uL (ref 3.80–5.10)
RDW: 12.2 % (ref 11.0–15.0)
WBC: 5.6 10*3/uL (ref 3.8–10.8)

## 2018-04-30 LAB — VITAMIN D 25 HYDROXY (VIT D DEFICIENCY, FRACTURES): VIT D 25 HYDROXY: 29 ng/mL — AB (ref 30–100)

## 2018-04-30 LAB — HIV ANTIBODY (ROUTINE TESTING W REFLEX): HIV 1&2 Ab, 4th Generation: NONREACTIVE

## 2018-04-30 LAB — TSH: TSH: 0.21 m[IU]/L — AB

## 2018-05-02 ENCOUNTER — Other Ambulatory Visit: Payer: Self-pay | Admitting: Nurse Practitioner

## 2018-05-02 DIAGNOSIS — E039 Hypothyroidism, unspecified: Secondary | ICD-10-CM

## 2018-05-02 MED ORDER — LEVOTHYROXINE SODIUM 137 MCG PO TABS: 150.0000 ug | ORAL_TABLET | Freq: Every day | ORAL | 0 refills | Status: DC

## 2018-05-06 ENCOUNTER — Encounter: Payer: Self-pay | Admitting: Family Medicine

## 2018-05-06 DIAGNOSIS — F418 Other specified anxiety disorders: Secondary | ICD-10-CM

## 2018-05-06 DIAGNOSIS — E039 Hypothyroidism, unspecified: Secondary | ICD-10-CM

## 2018-05-06 MED ORDER — LEVOTHYROXINE SODIUM 137 MCG PO TABS: 137.0000 ug | ORAL_TABLET | Freq: Every day | ORAL | 0 refills | Status: DC

## 2018-05-06 MED ORDER — BUPROPION HCL ER (XL) 300 MG PO TB24: 300.0000 mg | ORAL_TABLET | Freq: Every day | ORAL | 1 refills | Status: DC

## 2018-07-20 ENCOUNTER — Other Ambulatory Visit: Payer: Self-pay | Admitting: Family Medicine

## 2018-07-26 ENCOUNTER — Other Ambulatory Visit: Payer: Self-pay

## 2018-07-26 NOTE — Telephone Encounter (Signed)
Request received for Wellbutrin 150 mg BID That is not what she is supposed to be taking I reviewed the last note from Sharyon CableElizabeth Poulose, DNP She is now on Wellbutrin 300 mg XL version A 6 month supply of Wellbutrin 300 mg XL version was prescribed on May 06, 2018 She should not need a refill of Wellbutrin XL until early April   buPROPion buPROPion (WELLBUTRIN XL) 300 MG 24 hr tablet Take 1 tablet (300 mg total) by mouth daily., Starting Fri 05/06/2018, Normal  Dispense: 90 tablet  Refills: 1 ordered  [id number] [facility]- [place], [place] - [address] [facility] [address] (Ph: [phone])  Order Details Ordered on: 05/06/18  Associated Dx: Depression with anxiety  Authorizing provider: Kerman PasseyLada, Alpha Chouinard P, MD

## 2018-07-26 NOTE — Telephone Encounter (Signed)
Refill request was sent to Dr. Melinda Lada for approval and submission.  

## 2018-08-05 ENCOUNTER — Other Ambulatory Visit: Payer: Self-pay | Admitting: Family Medicine

## 2018-08-08 ENCOUNTER — Other Ambulatory Visit: Payer: Self-pay

## 2018-08-08 DIAGNOSIS — F418 Other specified anxiety disorders: Secondary | ICD-10-CM

## 2018-08-08 MED ORDER — BUPROPION HCL ER (XL) 300 MG PO TB24: 300.0000 mg | ORAL_TABLET | Freq: Every day | ORAL | 1 refills | Status: DC

## 2018-08-08 NOTE — Telephone Encounter (Signed)
New pharmacy, thank you Rx sent

## 2018-08-08 NOTE — Telephone Encounter (Signed)
New pharmacy

## 2018-08-27 ENCOUNTER — Other Ambulatory Visit: Payer: Self-pay | Admitting: Family Medicine

## 2018-08-27 DIAGNOSIS — E039 Hypothyroidism, unspecified: Secondary | ICD-10-CM

## 2018-08-29 NOTE — Telephone Encounter (Signed)
It looks like the patient was due for thyroid labs around November 11th Please ask her to come in ASAP for her labs In the future, we'll ask her to mark her calendar to put a reminder in her phone to get labs done Uncontrolled thyroid issues can cause complications, especially if doses go weeks or months without being monitored and adjusted I'll send five days of medicine and we'll ask her to come ASAP for the labs  Lab Results  Component Value Date   TSH 0.21 (L) 04/29/2018   T4TOTAL 7.3 10/08/2017

## 2018-08-30 NOTE — Telephone Encounter (Signed)
Left detailed voicemial 

## 2018-09-13 ENCOUNTER — Other Ambulatory Visit: Payer: Self-pay | Admitting: Family Medicine

## 2018-09-13 DIAGNOSIS — E039 Hypothyroidism, unspecified: Secondary | ICD-10-CM

## 2018-09-14 ENCOUNTER — Other Ambulatory Visit: Payer: Self-pay

## 2018-09-14 DIAGNOSIS — E039 Hypothyroidism, unspecified: Secondary | ICD-10-CM

## 2018-09-14 NOTE — Telephone Encounter (Signed)
Left detailed VM. CRM created. Endo referral placed.

## 2018-09-14 NOTE — Telephone Encounter (Signed)
Please see the last note about the thyroid medicine from January 25th: "It looks like the patient was due for thyroid labs around November 11th Please ask her to come in ASAP for her labs In the future, we'll ask her to mark her calendar to put a reminder in her phone to get labs done Uncontrolled thyroid issues can cause complications, especially if doses go weeks or months without being monitored and adjusted I'll send five days of medicine and we'll ask her to come ASAP for the labs"   I'm not going to be able to prescribe her thyroid medicine any more since she is not working with [place] to have this monitored safely She is not being compliant with monitoring Please REFER to endocrinology I'll prescribe five days of medicine She must come in for labs this week to get more

## 2018-10-03 ENCOUNTER — Other Ambulatory Visit: Payer: Self-pay | Admitting: Nurse Practitioner

## 2018-10-03 ENCOUNTER — Other Ambulatory Visit: Payer: Self-pay

## 2018-10-03 DIAGNOSIS — E039 Hypothyroidism, unspecified: Secondary | ICD-10-CM

## 2018-10-03 LAB — TSH: TSH: 26.39 m[IU]/L — AB

## 2018-10-04 ENCOUNTER — Other Ambulatory Visit: Payer: Self-pay | Admitting: Nurse Practitioner

## 2018-10-04 ENCOUNTER — Encounter: Payer: Self-pay | Admitting: Family Medicine

## 2018-10-04 DIAGNOSIS — E039 Hypothyroidism, unspecified: Secondary | ICD-10-CM

## 2018-10-05 ENCOUNTER — Other Ambulatory Visit: Payer: Self-pay | Admitting: Nurse Practitioner

## 2018-10-05 DIAGNOSIS — E039 Hypothyroidism, unspecified: Secondary | ICD-10-CM

## 2018-10-05 MED ORDER — LEVOTHYROXINE SODIUM 137 MCG PO TABS: 137.0000 ug | ORAL_TABLET | Freq: Every day | ORAL | 0 refills | Status: DC

## 2018-10-31 ENCOUNTER — Encounter: Payer: 59 | Admitting: Family Medicine

## 2018-11-17 ENCOUNTER — Other Ambulatory Visit: Payer: Self-pay | Admitting: Nurse Practitioner

## 2018-11-17 DIAGNOSIS — F418 Other specified anxiety disorders: Secondary | ICD-10-CM

## 2018-11-21 NOTE — Telephone Encounter (Signed)
Tried to call first #, not in service. LVM on second number for pt to call and schedule an appt

## 2018-11-29 ENCOUNTER — Ambulatory Visit (INDEPENDENT_AMBULATORY_CARE_PROVIDER_SITE_OTHER): Payer: 59 | Admitting: Family Medicine

## 2018-11-29 ENCOUNTER — Encounter: Payer: Self-pay | Admitting: Emergency Medicine

## 2018-11-29 ENCOUNTER — Other Ambulatory Visit: Payer: Self-pay

## 2018-11-29 ENCOUNTER — Encounter: Payer: Self-pay | Admitting: Family Medicine

## 2018-11-29 DIAGNOSIS — R197 Diarrhea, unspecified: Secondary | ICD-10-CM

## 2018-11-29 DIAGNOSIS — R110 Nausea: Secondary | ICD-10-CM

## 2018-11-29 MED ORDER — ONDANSETRON HCL 4 MG PO TABS: 4.0000 mg | ORAL_TABLET | Freq: Three times a day (TID) | ORAL | 0 refills | Status: DC | PRN

## 2018-11-29 NOTE — Progress Notes (Signed)
Name: Sheri Garrison   MRN: [id number]    DOB: July 28, 1976   Date:11/29/2018       Progress Note  Subjective  Chief Complaint  Chief Complaint  Patient presents with  . Diarrhea    for 4 days  . Headache    I connected with  Sheri Garrison  on 11/29/18 at  9:40 AM EDT by a video enabled telemedicine application and verified that I am speaking with the correct person using two identifiers.  I discussed the limitations of evaluation and management by telemedicine and the availability of in person appointments. The patient expressed understanding and agreed to proceed. Staff also discussed with the patient that there may be a patient responsible charge related to this service. Patient Location: Home Provider Location: Home Additional Individuals present: None  HPI  Pt presents with concern for diarrhea and nausea x4 days.  Also having headache x1 day.  Headache is waxing and waning.  She does have history of headaches, but this headache seems to be related to her GI illness. No vomiting, abdominal pain, blood in stool, fevers, no urinary symptoms, no respiratory illness, no recent sick contacts. Does endorse a few chills; diarrhea is about the same as it has been. Diarrhea episodes are about 6 in 24 hours, loose stools and occasionally watery.  She is working from home, took off yesterday and we will write her out for today as well. No recent travel.  Patient Active Problem List   Diagnosis Date Noted  . Iron deficiency anemia 03/23/2017  . Depression with anxiety 06/04/2016  . Anti-TPO antibodies present 01/14/2016  . Controlled substance agreement signed 01/03/2016  . Chronic anxiety 01/03/2016  . Panic disorder without agoraphobia 01/03/2016  . Annual physical exam 07/30/2015  . Skin lesion of back 07/30/2015  . Hypothyroidism, adult 04/09/2015  . Encounter for cholesteral screening for cardiovascular disease 04/09/2015  . Encounter for screening mammogram for malignant neoplasm of  breast 04/09/2015  . Heart palpitations 04/09/2015    Social History   Tobacco Use  . Smoking status: Never Smoker  . Smokeless tobacco: Never Used  Substance Use Topics  . Alcohol use: Yes    Alcohol/week: 0.0 standard drinks    Comment: occasionally    Current Outpatient Medications:  .  buPROPion (WELLBUTRIN XL) 300 MG 24 hr tablet, Take 1 tablet (300 mg total) by mouth daily., Disp: 90 tablet, Rfl: 1 .  levothyroxine (SYNTHROID, LEVOTHROID) 137 MCG tablet, Take 1 tablet (137 mcg total) by mouth daily., Disp: 47 tablet, Rfl: 0 .  Multiple Vitamin (MULTIVITAMIN) tablet, Take 1 tablet by mouth daily., Disp: , Rfl:  .  sertraline (ZOLOFT) 50 MG tablet, TAKE 1 TABLET BY MOUTH  DAILY (Patient not taking: Reported on 11/29/2018), Disp: 90 tablet, Rfl: 1  Allergies  Allergen Reactions  . Alka-Seltzer Plus Cold [Chlorphen-Phenyleph-Asa] Swelling  . Tessalon [Benzonatate] Hives and Swelling    I personally reviewed active problem list, medication list, allergies, lab results with the patient/caregiver today.  ROS  Ten systems reviewed and is negative except as mentioned in HPI  Objective  Virtual encounter, vitals not obtained.  There is no height or weight on file to calculate BMI.  Nursing Note and Vital Signs reviewed.  Physical Exam  Constitutional: Patient appears well-developed and well-nourished. No distress.  HENT: Head: Normocephalic and atraumatic.  Neck: Normal range of motion. Pulmonary/Chest: Effort normal. No respiratory distress. Speaking in complete sentences Neurological: Pt is alert and oriented to person, place,  and time. Coordination, speech and gait are normal.  Psychiatric: Patient has a normal mood and affect. behavior is normal. Judgment and thought content normal. Abdominal: Patient palpates her abdomen - appears soft, she denies any tenderness.  No results found for this or any previous visit (from the past 72 hour(s)).  Assessment & Plan  1.  Diarrhea of presumed infectious origin - Advised may take immodium OTC after providing GI panel sample. Recommend bland diet. - COMPLETE METABOLIC PANEL WITH GFR - CBC w/Diff/Platelet - Gastrointestinal Pathogen Panel PCR - did discussed COVID19 S&Sx's she is quite low risk for exposure and does not exhibit many of the classic symptoms; she will remain quarantined for 7 days since symptom onset, and once symptoms are improving; has not had a fever.  She is working from home, we will give her today off to get labs and start medications to hopefully start feeling better.  2. Nausea - Push fluids to help with nausea and headache, zofran provided to allow her to tolerate PO a bit better than she has been.  - ondansetron (ZOFRAN) 4 MG tablet; Take 1 tablet (4 mg total) by mouth every 8 (eight) hours as needed for nausea or vomiting.  Dispense: 20 tablet; Refill: 0  -Red flags and when to present for emergency care or RTC including fever >101.35F, chest pain, shortness of breath, new/worsening/un-resolving symptoms, vomiting, abdominal pain reviewed with patient at time of visit. Follow up and care instructions discussed and provided in AVS. - I discussed the assessment and treatment plan with the patient. The patient was provided an opportunity to ask questions and all were answered. The patient agreed with the plan and demonstrated an understanding of the instructions.  I provided 18 minutes of non-face-to-face time during this encounter.  Doren CustardEmily E Mckenzie Bove, FNP

## 2018-11-30 ENCOUNTER — Encounter: Payer: Self-pay | Admitting: Family Medicine

## 2018-11-30 ENCOUNTER — Other Ambulatory Visit: Payer: Self-pay | Admitting: Family Medicine

## 2018-12-01 LAB — GASTROINTESTINAL PATHOGEN PANEL PCR
C. difficile Tox A/B, PCR: NOT DETECTED
Campylobacter, PCR: DETECTED — AB
Cryptosporidium, PCR: NOT DETECTED
E coli (ETEC) LT/ST PCR: NOT DETECTED
E coli (STEC) stx1/stx2, PCR: NOT DETECTED
E coli 0157, PCR: NOT DETECTED
Giardia lamblia, PCR: NOT DETECTED
Norovirus, PCR: NOT DETECTED
Rotavirus A, PCR: NOT DETECTED
Salmonella, PCR: NOT DETECTED
Shigella, PCR: NOT DETECTED

## 2018-12-02 ENCOUNTER — Other Ambulatory Visit: Payer: Self-pay | Admitting: Family Medicine

## 2018-12-02 DIAGNOSIS — A045 Campylobacter enteritis: Secondary | ICD-10-CM

## 2018-12-02 MED ORDER — AZITHROMYCIN 500 MG PO TABS: 500.0000 mg | ORAL_TABLET | Freq: Every day | ORAL | 0 refills | Status: DC

## 2018-12-02 MED ORDER — DICYCLOMINE HCL 10 MG PO CAPS: 10.0000 mg | ORAL_CAPSULE | Freq: Three times a day (TID) | ORAL | 0 refills | Status: DC

## 2018-12-26 ENCOUNTER — Other Ambulatory Visit: Payer: Self-pay | Admitting: Family Medicine

## 2018-12-26 DIAGNOSIS — F418 Other specified anxiety disorders: Secondary | ICD-10-CM

## 2018-12-27 NOTE — Telephone Encounter (Signed)
Please call to schedule CPE + Follow Up - may do a split visit appointment or schedule two separate appts.

## 2019-01-09 ENCOUNTER — Other Ambulatory Visit: Payer: Self-pay | Admitting: Nurse Practitioner

## 2019-01-09 DIAGNOSIS — E039 Hypothyroidism, unspecified: Secondary | ICD-10-CM

## 2019-03-06 ENCOUNTER — Other Ambulatory Visit: Payer: Self-pay | Admitting: Family Medicine

## 2019-03-06 DIAGNOSIS — Z1231 Encounter for screening mammogram for malignant neoplasm of breast: Secondary | ICD-10-CM

## 2019-03-20 ENCOUNTER — Other Ambulatory Visit: Payer: Self-pay | Admitting: Nurse Practitioner

## 2019-03-20 DIAGNOSIS — E039 Hypothyroidism, unspecified: Secondary | ICD-10-CM

## 2019-03-22 ENCOUNTER — Other Ambulatory Visit: Payer: Self-pay | Admitting: Family Medicine

## 2019-03-22 DIAGNOSIS — F418 Other specified anxiety disorders: Secondary | ICD-10-CM

## 2019-03-28 ENCOUNTER — Telehealth: Payer: Self-pay

## 2019-03-28 ENCOUNTER — Other Ambulatory Visit
Admission: RE | Admit: 2019-03-28 | Discharge: 2019-03-28 | Disposition: A | Discharge status: Home / Self Care | Payer: No Typology Code available for payment source | Source: Ambulatory Visit | Attending: Family Medicine | Admitting: Family Medicine

## 2019-03-28 DIAGNOSIS — E039 Hypothyroidism, unspecified: Secondary | ICD-10-CM | POA: Diagnosis not present

## 2019-03-28 LAB — TSH: TSH: 19.131 u[IU]/mL — ABNORMAL HIGH (ref 0.350–4.500)

## 2019-03-28 NOTE — Addendum Note (Signed)
Addended by: Santiago Bur on: 03/28/2019 12:06 PM   Modules accepted: Orders

## 2019-03-28 NOTE — Telephone Encounter (Signed)
-----   Message from Fredderick Severance, NP sent at 03/28/2019  1:37 PM EDT ----- I sent a mychart message as well but please let patient know her TSH results are elevated and she needs to follow-up with her endocrinologist so they can titrate her dose up.

## 2019-03-30 ENCOUNTER — Ambulatory Visit (INDEPENDENT_AMBULATORY_CARE_PROVIDER_SITE_OTHER): Payer: Self-pay | Admitting: Family Medicine

## 2019-03-30 ENCOUNTER — Encounter: Payer: Self-pay | Admitting: Family Medicine

## 2019-03-30 ENCOUNTER — Other Ambulatory Visit: Payer: Self-pay

## 2019-03-30 VITALS — BP 118/70 | HR 81 | Temp 97.8°F | Resp 16 | Ht 62.0 in | Wt 171.5 lb

## 2019-03-30 DIAGNOSIS — F418 Other specified anxiety disorders: Secondary | ICD-10-CM

## 2019-03-30 DIAGNOSIS — E039 Hypothyroidism, unspecified: Secondary | ICD-10-CM

## 2019-03-30 MED ORDER — SERTRALINE HCL 50 MG PO TABS: 75.0000 mg | ORAL_TABLET | Freq: Every day | ORAL | 0 refills | Status: DC

## 2019-03-30 MED ORDER — LEVOTHYROXINE SODIUM 150 MCG PO TABS: 150.0000 ug | ORAL_TABLET | Freq: Every day | ORAL | 1 refills | Status: DC

## 2019-03-30 NOTE — Patient Instructions (Addendum)
-   Prozac - increases energy, does not gain weight, and can increase anxiety in some patients - Lexapro - weight neutral, works really well for combination depression and anxiety  - Buspar - works well for anxiety and can be added on to [employer]

## 2019-03-30 NOTE — Progress Notes (Signed)
Name: Sheri Garrison   MRN: [id number]    DOB: 05/08/1976   Date:03/30/2019       Progress Note  Subjective  Chief Complaint  Chief Complaint  Patient presents with  . Anxiety    discuss wellbutrin    HPI  Hypothyroid: Last tsh was very elevated and dose was increased to 137 mcg daily, this was rechecked by Poulose NP-C 03/28/2019 and had not improved much; pt has not followed up with endocrinology since 2018, prior PCP Dr. Sherie DonLada was treating since 2018.  She has had US and dye testing all which was normal, no history of nodules. Takes it first thing the morning with a full glass of water, has been taking it with wellbutrin. No missed doses.   Endorses fatigue, some constipation, dry hair, brittle nails- keeps acrylic, sleepy all the time, frequent naps and no energy.  Denies cold intolerance, heat intolerance, palpitations, diarrhea.  Lab Results  Component Value Date   TSH 19.131 (H) 03/28/2019   Depression & Anxiety: Takes wellbutrin 300mg  CL and zoloft- as working well for her, but she is now noticing lack of motivation, fatigue. No missed doses has been on it for a few years. States when she started wellbutrin noticed she has more motivation but feels it is not working as well as when she started taking it. Discussed several options for medication changes, though I do feel a lot of her fatigue and decreased motivation are likely related to her very elevated TSH levels.    Patient Active Problem List   Diagnosis Date Noted  . Iron deficiency anemia 03/23/2017  . Depression with anxiety 06/04/2016  . Anti-TPO antibodies present 01/14/2016  . Controlled substance agreement signed 01/03/2016  . Chronic anxiety 01/03/2016  . Panic disorder without agoraphobia 01/03/2016  . Annual physical exam 07/30/2015  . Skin lesion of back 07/30/2015  . Hypothyroidism, adult 04/09/2015  . Encounter for cholesteral screening for cardiovascular disease 04/09/2015  . Encounter for screening  mammogram for malignant neoplasm of breast 04/09/2015  . Heart palpitations 04/09/2015    Past Surgical History:  Procedure Laterality Date  . CHOLECYSTECTOMY  2004  . LAPAROSCOPIC GASTRIC SLEEVE RESECTION  2014    Family History  Problem Relation Age of Onset  . Heart disease Mother   . Hyperlipidemia Mother   . Hypertension Mother   . Breast cancer Mother [id number]  . Hearing loss Father        due to age  . Arthritis Maternal Grandmother   . Hyperlipidemia Maternal Grandmother   . Hypertension Maternal Grandmother   . Heart disease Maternal Grandfather   . Hyperlipidemia Maternal Grandfather   . Hypertension Maternal Grandfather   . Stroke Paternal Grandmother   . Heart disease Paternal Grandfather   . Depression Maternal Aunt   . Hyperlipidemia Maternal Aunt   . Hypertension Maternal Aunt   . Breast cancer Maternal Aunt        7150's    Social History   Socioeconomic History  . Marital status: Married    Spouse name: Not on file  . Number of children: Not on file  . Years of education: Not on file  . Highest education level: Not on file  Occupational History  . Not on file  Social Needs  . Financial resource strain: Not on file  . Food insecurity    Worry: Not on file    Inability: Not on file  . Transportation needs    Medical: Not on  file    Non-medical: Not on file  Tobacco Use  . Smoking status: Never Smoker  . Smokeless tobacco: Never Used  Substance and Sexual Activity  . Alcohol use: Yes    Alcohol/week: 0.0 standard drinks    Comment: occasionally  . Drug use: No  . Sexual activity: Yes    Partners: Male    Birth control/protection: None  Lifestyle  . Physical activity    Days per week: Not on file    Minutes per session: Not on file  . Stress: Not on file  Relationships  . Social Musician on phone: Not on file    Gets together: Not on file    Attends religious service: Not on file    Active member of club or organization: Not on  file    Attends meetings of clubs or organizations: Not on file    Relationship status: Not on file  . Intimate partner violence    Fear of current or ex partner: Not on file    Emotionally abused: Not on file    Physically abused: Not on file    Forced sexual activity: Not on file  Other Topics Concern  . Not on file  Social History Narrative  . Not on file     Current Outpatient Medications:  .  buPROPion (WELLBUTRIN XL) 300 MG 24 hr tablet, TAKE 1 TABLET BY MOUTH  DAILY, Disp: 90 tablet, Rfl: 0 .  Multiple Vitamin (MULTIVITAMIN) tablet, Take 1 tablet by mouth daily., Disp: , Rfl:  .  levothyroxine (SYNTHROID) 150 MCG tablet, Take 1 tablet (150 mcg total) by mouth daily., Disp: 45 tablet, Rfl: 1 .  ondansetron (ZOFRAN) 4 MG tablet, Take 1 tablet (4 mg total) by mouth every 8 (eight) hours as needed for nausea or vomiting. (Patient not taking: Reported on 03/30/2019), Disp: 20 tablet, Rfl: 0 .  sertraline (ZOLOFT) 50 MG tablet, Take 1.5 tablets (75 mg total) by mouth daily., Disp: 135 tablet, Rfl: 0  Allergies  Allergen Reactions  . Alka-Seltzer Plus Cold [Chlorphen-Phenyleph-Asa] Swelling  . Tessalon [Benzonatate] Hives and Swelling    I personally reviewed active problem list, medication list, allergies, notes from last encounter, lab results with the patient/caregiver today.   ROS  Constitutional: Negative for fever or weight change.  Respiratory: Negative for cough and shortness of breath.   Cardiovascular: Negative for chest pain or palpitations.  Gastrointestinal: Negative for abdominal pain, no bowel changes.  Musculoskeletal: Negative for gait problem or joint swelling.  Skin: Negative for rash.  Neurological: Negative for dizziness or headache.  No other specific complaints in a complete review of systems (except as listed in HPI above).  Objective  Vitals:   03/30/19 0908  BP: 118/70  Pulse: 81  Resp: 16  Temp: 97.8 F (36.6 C)  TempSrc: Oral  SpO2: 97%   Weight: 171 lb 8 oz (77.8 kg)  Height: 5\' 2"  (1.575 m)    Body mass index is 31.37 kg/m.  Physical Exam  Constitutional: Patient appears well-developed and well-nourished. No distress.  HENT: Head: Normocephalic and atraumatic. Eyes: Conjunctivae and EOM are normal. No scleral icterus. Neck: Normal range of motion. Neck supple. No JVD present. No thyromegaly present.  Cardiovascular: Normal rate, regular rhythm and normal heart sounds.  No murmur heard. No BLE edema. Pulmonary/Chest: Effort normal and breath sounds normal. No respiratory distress. Musculoskeletal: Normal range of motion, no joint effusions. No gross deformities Neurological: Pt is alert and oriented to  person, place, and time. No cranial nerve deficit. Coordination, balance, strength, speech and gait are normal.  Skin: Skin is warm and dry. No rash noted. No erythema.  Psychiatric: Patient has a normal mood and affect. behavior is normal. Judgment and thought content normal.  Results for orders placed or performed during the hospital encounter of 03/28/19 (from the past 72 hour(s))  TSH     Status: Abnormal   Collection Time: 03/28/19 12:19 PM  Result Value Ref Range   TSH 19.131 (H) 0.350 - 4.500 uIU/mL    Comment: Performed by a 3rd Generation assay with a functional sensitivity of <=0.01 uIU/mL. Performed at [facility], [place], [place] [address]     PHQ2/9: Depression screen [facility] 2/9 03/30/2019 11/29/2018 04/29/2018 03/23/2017 12/11/2016  Decreased Interest 2 0 1 0 0  Down, Depressed, Hopeless 1 0 0 0 0  PHQ - 2 Score 3 0 1 0 0  Altered sleeping 0 0 0 - -  Tired, decreased energy 3 0 3 - -  Change in appetite 0 0 0 - -  Feeling bad or failure about yourself  1 0 0 - -  Trouble concentrating 1 0 0 - -  Moving slowly or fidgety/restless 0 0 0 - -  Suicidal thoughts 0 0 0 - -  PHQ-9 Score 8 0 4 - -  Difficult doing work/chores Somewhat difficult Not difficult at all Not difficult at  all - -   PHQ-2/9 Result is positive.    Fall Risk: Fall Risk  03/30/2019 11/29/2018 04/29/2018 04/29/2018 03/23/2017  Falls in the past year? 0 0 No No No  Number falls in past yr: 0 0 - - -  Comment - - - - -  Injury with Fall? 0 0 - - -  Follow up Falls evaluation completed Falls evaluation completed - - -   Assessment & Plan  1. Hypothyroidism, adult - Compliant with medication; recheck in 6 weeks. - levothyroxine (SYNTHROID) 150 MCG tablet; Take 1 tablet (150 mcg total) by mouth daily.  Dispense: 45 tablet; Refill: 1  2. Depression with anxiety - Increase Zoloft to 75mg , continue Wellbutrin for now.  Recheck labs at next visit and consider switching to prozac if not improving with TSH improvement and with increase of Zoloft. - sertraline (ZOLOFT) 50 MG tablet; Take 1.5 tablets (75 mg total) by mouth daily.  Dispense: 135 tablet; Refill: 0 - Recommend counseling.  Face-to-face time with patient was more than 25 minutes, >50% time spent counseling and coordination of care

## 2019-04-12 ENCOUNTER — Inpatient Hospital Stay: Admission: RE | Admit: 2019-04-12 | Payer: 59 | Source: Ambulatory Visit

## 2019-05-12 ENCOUNTER — Other Ambulatory Visit: Payer: Self-pay

## 2019-05-12 ENCOUNTER — Ambulatory Visit (INDEPENDENT_AMBULATORY_CARE_PROVIDER_SITE_OTHER): Payer: 59 | Admitting: Family Medicine

## 2019-05-12 ENCOUNTER — Encounter: Payer: Self-pay | Admitting: Family Medicine

## 2019-05-12 DIAGNOSIS — F3341 Major depressive disorder, recurrent, in partial remission: Secondary | ICD-10-CM

## 2019-05-12 DIAGNOSIS — F418 Other specified anxiety disorders: Secondary | ICD-10-CM

## 2019-05-12 DIAGNOSIS — E039 Hypothyroidism, unspecified: Secondary | ICD-10-CM

## 2019-05-12 MED ORDER — SERTRALINE HCL 50 MG PO TABS: 75.0000 mg | ORAL_TABLET | Freq: Every day | ORAL | 0 refills | Status: DC

## 2019-05-12 NOTE — Progress Notes (Signed)
Name: Sheri Garrison   MRN: [id number]    DOB: 1976/03/05   Date:05/12/2019       Progress Note  Subjective  Chief Complaint  Chief Complaint  Patient presents with  . Follow-up    I connected with  Sheri Garrison on 05/12/19 at  9:20 AM EDT by telephone and verified that I am speaking with the correct person using two identifiers.  I discussed the limitations, risks, security and privacy concerns of performing an evaluation and management service by telephone and the availability of in person appointments. Staff also discussed with the patient that there may be a patient responsible charge related to this service. Patient Location: Home Provider Location: Office Additional Individuals present: None  HPI  Hypothyroid: Last tsh was elevated at last visit and dose was increased to 150 mcg daily, pt has not followed up with endocrinology since 2018, prior PCP Dr. Sherie DonLada was treating since 2018.  She has had US and dye testing all which was normal, no history of nodules. Takes med first thing the morning with a full glass of water, has been taking it with wellbutrin. No missed doses.   Endorses fatigue; constipation resolved; still having dry hair, brittle nails- keeps acrylic. Denies cold intolerance, heat intolerance, palpitations, diarrhea, no longer needing to nap. Due for lab retesting today.  Lab Results  Component Value Date   TSH 19.131 (H) 03/28/2019   Depression & Anxiety: Takes wellbutrin 300mg  CL and Zoloft (increased to 75mg  at last visit)- has working well for her - motivation has improved with increase in Zoloft.  Anxiety is under good control at this time.  No missed doses has been on it for a few years.   Patient Active Problem List   Diagnosis Date Noted  . Iron deficiency anemia 03/23/2017  . Depression with anxiety 06/04/2016  . Anti-TPO antibodies present 01/14/2016  . Controlled substance agreement signed 01/03/2016  . Chronic anxiety 01/03/2016  . Panic disorder  without agoraphobia 01/03/2016  . Skin lesion of back 07/30/2015  . Hypothyroidism, adult 04/09/2015  . Encounter for cholesteral screening for cardiovascular disease 04/09/2015  . Encounter for screening mammogram for malignant neoplasm of breast 04/09/2015  . Heart palpitations 04/09/2015    Past Surgical History:  Procedure Laterality Date  . CHOLECYSTECTOMY  2004  . LAPAROSCOPIC GASTRIC SLEEVE RESECTION  2014    Family History  Problem Relation Age of Onset  . Heart disease Mother   . Hyperlipidemia Mother   . Hypertension Mother   . Breast cancer Mother 375  . Hearing loss Father        due to age  . Arthritis Maternal Grandmother   . Hyperlipidemia Maternal Grandmother   . Hypertension Maternal Grandmother   . Heart disease Maternal Grandfather   . Hyperlipidemia Maternal Grandfather   . Hypertension Maternal Grandfather   . Stroke Paternal Grandmother   . Heart disease Paternal Grandfather   . Depression Maternal Aunt   . Hyperlipidemia Maternal Aunt   . Hypertension Maternal Aunt   . Breast cancer Maternal Aunt        4450's    Social History   Socioeconomic History  . Marital status: Married    Spouse name: Not on file  . Number of children: Not on file  . Years of education: Not on file  . Highest education level: Not on file  Occupational History  . Not on file  Social Needs  . Financial resource strain: Not on file  .  Food insecurity    Worry: Not on file    Inability: Not on file  . Transportation needs    Medical: Not on file    Non-medical: Not on file  Tobacco Use  . Smoking status: Never Smoker  . Smokeless tobacco: Never Used  Substance and Sexual Activity  . Alcohol use: Yes    Alcohol/week: 0.0 standard drinks    Comment: occasionally  . Drug use: No  . Sexual activity: Yes    Partners: Male    Birth control/protection: None  Lifestyle  . Physical activity    Days per week: Not on file    Minutes per session: Not on file  .  Stress: Not on file  Relationships  . Social Herbalist on phone: Not on file    Gets together: Not on file    Attends religious service: Not on file    Active member of club or organization: Not on file    Attends meetings of clubs or organizations: Not on file    Relationship status: Not on file  . Intimate partner violence    Fear of current or ex partner: Not on file    Emotionally abused: Not on file    Physically abused: Not on file    Forced sexual activity: Not on file  Other Topics Concern  . Not on file  Social History Narrative  . Not on file     Current Outpatient Medications:  .  buPROPion (WELLBUTRIN XL) 300 MG 24 hr tablet, TAKE 1 TABLET BY MOUTH  DAILY, Disp: 90 tablet, Rfl: 0 .  levothyroxine (SYNTHROID) 150 MCG tablet, Take 1 tablet (150 mcg total) by mouth daily., Disp: 45 tablet, Rfl: 1 .  Multiple Vitamin (MULTIVITAMIN) tablet, Take 1 tablet by mouth daily., Disp: , Rfl:  .  sertraline (ZOLOFT) 50 MG tablet, Take 1.5 tablets (75 mg total) by mouth daily., Disp: 135 tablet, Rfl: 0 .  ondansetron (ZOFRAN) 4 MG tablet, Take 1 tablet (4 mg total) by mouth every 8 (eight) hours as needed for nausea or vomiting. (Patient not taking: Reported on 03/30/2019), Disp: 20 tablet, Rfl: 0  Allergies  Allergen Reactions  . Alka-Seltzer Plus Cold [Chlorphen-Phenyleph-Asa] Swelling  . Tessalon [Benzonatate] Hives and Swelling    I personally reviewed active problem list, medication list, allergies, notes from last encounter, lab results with the patient/caregiver today.   ROS  Ten systems reviewed and is negative except as mentioned in HPI  Objective  Virtual encounter, vitals not obtained.  There is no height or weight on file to calculate BMI.  Physical Exam  Pulmonary/Chest: Effort normal. No respiratory distress. Speaking in complete sentences Neurological: Pt is alert and oriented to person, place, and time. Coordination, speech and gait are normal.   Psychiatric: Patient has a normal mood and affect. behavior is normal. Judgment and thought content normal.  No results found for this or any previous visit (from the past 72 hour(s)).  PHQ2/9: Depression screen [facility] 2/9 05/12/2019 03/30/2019 11/29/2018 04/29/2018 03/23/2017  Decreased Interest 1 2 0 1 0  Down, Depressed, Hopeless 1 1 0 0 0  PHQ - 2 Score 2 3 0 1 0  Altered sleeping 1 0 0 0 -  Tired, decreased energy 1 3 0 3 -  Change in appetite 0 0 0 0 -  Feeling bad or failure about yourself  0 1 0 0 -  Trouble concentrating 0 1 0 0 -  Moving slowly or  fidgety/restless 0 0 0 0 -  Suicidal thoughts 0 0 0 0 -  PHQ-9 Score 4 8 0 4 -  Difficult doing work/chores Not difficult at all Somewhat difficult Not difficult at all Not difficult at all -   PHQ-2/9 Result is positive.    Fall Risk: Fall Risk  05/12/2019 03/30/2019 11/29/2018 04/29/2018 04/29/2018  Falls in the past year? 0 0 0 No No  Number falls in past yr: 0 0 0 - -  Comment - - - - -  Injury with Fall? 0 0 0 - -  Follow up Falls evaluation completed Falls evaluation completed Falls evaluation completed - -     Assessment & Plan  1. Major depressive disorder, recurrent episode, in partial remission with anxious distress (HCC) - Improving with new Zoloft Dosing; however I do believe her fatigue is ongoing due to persistently elevated TSH.  2. Hypothyroidism, adult - Will adjust dosing accordingly based on lab results; does not want to see endocrinology at this time. - TSH  I discussed the assessment and treatment plan with the patient. The patient was provided an opportunity to ask questions and all were answered. The patient agreed with the plan and demonstrated an understanding of the instructions.   The patient was advised to call back or seek an in-person evaluation if the symptoms worsen or if the condition fails to improve as anticipated.  I provided 10 minutes of non-face-to-face time during this encounter.  Doren Custard, FNP

## 2019-06-08 ENCOUNTER — Telehealth: Payer: Self-pay | Admitting: Family Medicine

## 2019-06-08 NOTE — Telephone Encounter (Signed)
Left message for patient

## 2019-06-08 NOTE — Telephone Encounter (Signed)
Please call patient to come in for labs.  

## 2019-06-12 ENCOUNTER — Other Ambulatory Visit: Payer: Self-pay | Admitting: Family Medicine

## 2019-06-12 DIAGNOSIS — F418 Other specified anxiety disorders: Secondary | ICD-10-CM

## 2019-06-23 ENCOUNTER — Other Ambulatory Visit: Payer: Self-pay | Admitting: Family Medicine

## 2019-06-23 DIAGNOSIS — E039 Hypothyroidism, unspecified: Secondary | ICD-10-CM

## 2019-06-23 NOTE — Telephone Encounter (Signed)
Requested medication (s) are due for refill today: yes  Requested medication (s) are on the active medication list:yes  Last refill:  05/24/2019  Future visit scheduled: no  Notes to clinic:  Review for refill Patient is due for labs   Requested Prescriptions  Pending Prescriptions Disp Refills   levothyroxine (SYNTHROID) 150 MCG tablet [Pharmacy Med Name: LEVOTHYROXINE 0.150MG  (150MCG) TAB] 45 tablet 1    Sig: TAKE 1 TABLET(150 MCG) BY MOUTH DAILY     Endocrinology:  Hypothyroid Agents Failed - 06/23/2019  8:37 AM      Failed - TSH needs to be rechecked within 3 months after an abnormal result. Refill until TSH is due.      Failed - TSH in normal range and within 360 days    TSH  Date Value Ref Range Status  03/28/2019 19.131 (H) 0.350 - 4.500 uIU/mL Final    Comment:    Performed by a 3rd Generation assay with a functional sensitivity of <=0.01 uIU/mL. Performed at [facility], [address] [place], [place] [address]   10/03/2018 26.39 (H) mIU/L Final    Comment:              Reference Range .           > or = 20 Years  0.40-4.50 .                Pregnancy Ranges           First trimester    0.26-2.66           Second trimester   0.55-2.73           Third trimester    0.43-2.91          Passed - Valid encounter within last 12 months    Recent Outpatient Visits          1 month ago Hypothyroidism, adult   [place], [place]   2 months ago Hypothyroidism, adult   [facility] Hubbard Hartshorn, FNP   6 months ago Diarrhea of presumed infectious origin   [facility] Hubbard Hartshorn, [place]   1 year ago Hypothyroidism, adult   [place], [place]   2 years ago Hypothyroidism, adult   [place], Satira Anis, MD

## 2019-06-23 NOTE — Telephone Encounter (Signed)
Please call patient and make sure she comes in for labs in the next 1-2 weeks

## 2019-07-31 ENCOUNTER — Encounter: Payer: Self-pay | Admitting: Family Medicine

## 2019-08-01 MED ORDER — ALPRAZOLAM 0.25 MG PO TABS: 0.1200 mg | ORAL_TABLET | Freq: Two times a day (BID) | ORAL | 0 refills | Status: DC | PRN

## 2019-08-01 NOTE — Telephone Encounter (Signed)
Called patient directly.  Husband passed away very suddenly on 12/06/22, she is struggling with high anxiety and stress at this time.  She has taken Xanax previously and understands the risks of the medication including respiratory depression.  She denies any SI/HI, has support of her family surrounding her during this time.  Declines any additional resources at this time.  I advised that I will send #20, but no additional refills will be provided to her after this and she is agreeable.  My condolences are passed to her and she knows that she may always call if she has questions or needs additional mental health resources.

## 2019-08-01 NOTE — Telephone Encounter (Signed)
Copied from [place] [id number]. Topic: General - Other >> Aug 01, 2019 10:36 AM Rainey Pines A wrote: Patients husband passed away 2022/12/17 and would like a callback from nurse as soon as possible today in regards to getting xanax medication sent to pharmacy before having to go see her husband today at 4pm. Please advise

## 2019-08-07 ENCOUNTER — Other Ambulatory Visit: Payer: Self-pay | Admitting: Emergency Medicine

## 2019-08-07 DIAGNOSIS — F418 Other specified anxiety disorders: Secondary | ICD-10-CM

## 2019-08-07 MED ORDER — SERTRALINE HCL 50 MG PO TABS: 75.0000 mg | ORAL_TABLET | Freq: Every day | ORAL | 1 refills | Status: DC

## 2019-08-10 ENCOUNTER — Other Ambulatory Visit: Payer: Self-pay | Admitting: Family Medicine

## 2019-08-10 DIAGNOSIS — E039 Hypothyroidism, unspecified: Secondary | ICD-10-CM

## 2019-08-10 LAB — TSH: TSH: 3.58 mIU/L

## 2019-08-11 ENCOUNTER — Other Ambulatory Visit: Payer: Self-pay | Admitting: Family Medicine

## 2019-08-11 DIAGNOSIS — E039 Hypothyroidism, unspecified: Secondary | ICD-10-CM

## 2019-08-14 MED ORDER — LEVOTHYROXINE SODIUM 150 MCG PO TABS: 150.0000 ug | ORAL_TABLET | Freq: Every day | ORAL | 1 refills | Status: DC

## 2019-08-14 NOTE — Telephone Encounter (Signed)
Your pt

## 2019-08-22 ENCOUNTER — Other Ambulatory Visit: Payer: Self-pay | Admitting: Family Medicine

## 2019-08-22 DIAGNOSIS — F418 Other specified anxiety disorders: Secondary | ICD-10-CM

## 2019-08-24 ENCOUNTER — Other Ambulatory Visit: Payer: Self-pay | Admitting: Family Medicine

## 2019-08-24 ENCOUNTER — Encounter: Payer: Self-pay | Admitting: Family Medicine

## 2019-08-24 DIAGNOSIS — F4321 Adjustment disorder with depressed mood: Secondary | ICD-10-CM

## 2019-08-25 MED ORDER — HYDROXYZINE HCL 25 MG PO TABS: 25.0000 mg | ORAL_TABLET | Freq: Three times a day (TID) | ORAL | 0 refills | Status: DC | PRN

## 2019-08-25 NOTE — Addendum Note (Signed)
Addended by: Doren Custard on: 08/25/2019 11:53 AM   Modules accepted: Orders

## 2019-08-28 ENCOUNTER — Ambulatory Visit (INDEPENDENT_AMBULATORY_CARE_PROVIDER_SITE_OTHER): Payer: Self-pay | Admitting: Family Medicine

## 2019-08-28 ENCOUNTER — Encounter: Payer: Self-pay | Admitting: Family Medicine

## 2019-08-28 VITALS — Ht 62.0 in | Wt 154.0 lb

## 2019-08-28 DIAGNOSIS — G4709 Other insomnia: Secondary | ICD-10-CM

## 2019-08-28 DIAGNOSIS — F4321 Adjustment disorder with depressed mood: Secondary | ICD-10-CM

## 2019-08-28 DIAGNOSIS — F3341 Major depressive disorder, recurrent, in partial remission: Secondary | ICD-10-CM

## 2019-08-28 DIAGNOSIS — F418 Other specified anxiety disorders: Secondary | ICD-10-CM

## 2019-08-28 NOTE — Patient Instructions (Signed)
Seroquel  Trazodone

## 2019-08-28 NOTE — Progress Notes (Signed)
Name: Sheri Garrison   MRN: [id number]    DOB: [birth year]   Date:08/28/2019       Progress Note  Subjective  Chief Complaint  Chief Complaint  Patient presents with  . Medication Management    Discuss hydroxyzine,patients husband passed the day after Christmas    I connected with  Adelynne R Laurent  on 08/28/19 at  1:00 PM EST by a video enabled telemedicine application and verified that I am speaking with the correct person using two identifiers.  I discussed the limitations of evaluation and management by telemedicine and the availability of in person appointments. The patient expressed understanding and agreed to proceed. Staff also discussed with the patient that there may be a patient responsible charge related to this service. Patient Location: Home Provider Location: Office Additional Individuals present: None  HPI  Pt presents to discuss grief reaction, insomnia, anxiety.  She was given Xanax in December after her husband passed away, she is aware that this was a temporary and will not be refilled at this time.  She would like to discuss alternative options.  We discussed hydroxyzine, trazodone, and seroquel as potential options for sleep aid and to help reduce her anxiety.  Will trial hydroxyzine for 3-4 days, if not effective, we will switch to seroquel 25mg  QHS after discussing risk/benefit of this vs trazodone. She is taking Zoloft 75mg  and Wellbutrin daily.  If hydroxyzine is working for sleep, but her anxiety and depression symptoms are still persistent, we will increase zoloft to 100mg .  She goes to [facility]. , is talking to her pastor, has also initiate   Patient Active Problem List   Diagnosis Date Noted  . Iron deficiency anemia 03/23/2017  . Depression with anxiety 06/04/2016  . Anti-TPO antibodies present 01/14/2016  . Panic disorder without agoraphobia 01/03/2016  . Hypothyroidism, adult 04/09/2015    Social History   Tobacco Use  . Smoking status: Never  Smoker  . Smokeless tobacco: Never Used  Substance Use Topics  . Alcohol use: Yes    Alcohol/week: 0.0 standard drinks    Comment: occasionally     Current Outpatient Medications:  .  buPROPion (WELLBUTRIN XL) 300 MG 24 hr tablet, TAKE 1 TABLET BY MOUTH  DAILY, Disp: 90 tablet, Rfl: 3 .  levothyroxine (SYNTHROID) 150 MCG tablet, Take 1 tablet (150 mcg total) by mouth daily before breakfast., Disp: 90 tablet, Rfl: 1 .  Multiple Vitamin (MULTIVITAMIN) tablet, Take 1 tablet by mouth daily., Disp: , Rfl:  .  sertraline (ZOLOFT) 50 MG tablet, TAKE 1 AND 1/2 TABLETS(75 MG) BY MOUTH DAILY, Disp: 135 tablet, Rfl: 1 .  ALPRAZolam (XANAX) 0.25 MG tablet, Take 0.5-1 tablets (0.125-0.25 mg total) by mouth 2 (two) times daily as needed for anxiety. (Patient not taking: Reported on 08/28/2019), Disp: 20 tablet, Rfl: 0 .  hydrOXYzine (ATARAX/VISTARIL) 25 MG tablet, Take 1 tablet (25 mg total) by mouth 3 (three) times daily as needed for anxiety. (Patient not taking: Reported on 08/28/2019), Disp: 20 tablet, Rfl: 0  Allergies  Allergen Reactions  . Alka-Seltzer Plus Cold [Chlorphen-Phenyleph-Asa] Swelling  . Tessalon [Benzonatate] Hives and Swelling    I personally reviewed active problem list, medication list, allergies, notes from last encounter, lab results with the patient/caregiver today.  ROS  Constitutional: Negative for fever or weight change.  Respiratory: Negative for cough and shortness of breath.   Cardiovascular: Negative for chest pain or palpitations.  Gastrointestinal: Negative for abdominal pain, no bowel changes.  Musculoskeletal:  Negative for gait problem or joint swelling.  Skin: Negative for rash.  Neurological: Negative for dizziness or headache.  No other specific complaints in a complete review of systems (except as listed in HPI above).  Objective  Virtual encounter, vitals not obtained.  Body mass index is 28.17 kg/m.  Nursing Note and Vital Signs  reviewed.  Physical Exam   Constitutional: Patient appears well-developed and well-nourished. No distress.  HENT: Head: Normocephalic and atraumatic.  Neck: Normal range of motion. Pulmonary/Chest: Effort normal. No respiratory distress. Speaking in complete sentences Neurological: Pt is alert and oriented to person, place, and time. Coordination, speech are normal.  Psychiatric: Patient has a depressed mood and tearful affect. behavior is appropriate. Judgment and thought content normal.    No results found for this or any previous visit (from the past 72 hour(s)).  Assessment & Plan  1. Grief reaction - Receiving counseling through church and formal counselor weekly. 2. Major depressive disorder, recurrent episode, in partial remission with anxious distress (HCC) 3. Other insomnia Will trial hydroxyzine for 3-4 days, if not effective, we will switch to seroquel 25mg  QHS after discussing risk/benefit of this vs trazodone. She is taking Zoloft 75mg  and Wellbutrin daily.  If hydroxyzine is working for sleep, but her anxiety and depression symptoms are still persistent, we will increase zoloft to 100mg .   -Red flags and when to present for emergency care or RTC including fever >101.42F, chest pain, shortness of breath, new/worsening/un-resolving symptoms, reviewed with patient at time of visit. Follow up and care instructions discussed and provided in AVS. - I discussed the assessment and treatment plan with the patient. The patient was provided an opportunity to ask questions and all were answered. The patient agreed with the plan and demonstrated an understanding of the instructions.  I provided 16 minutes of non-face-to-face time during this encounter.  [place] Hartshorn, FNP

## 2019-08-31 ENCOUNTER — Ambulatory Visit: Payer: 59

## 2019-09-08 ENCOUNTER — Ambulatory Visit: Payer: No Typology Code available for payment source

## 2019-09-11 ENCOUNTER — Ambulatory Visit: Payer: 59

## 2019-09-22 ENCOUNTER — Other Ambulatory Visit: Payer: Self-pay | Admitting: Family Medicine

## 2019-09-22 DIAGNOSIS — F4321 Adjustment disorder with depressed mood: Secondary | ICD-10-CM

## 2019-09-22 MED ORDER — HYDROXYZINE HCL 25 MG PO TABS: 25.0000 mg | ORAL_TABLET | Freq: Three times a day (TID) | ORAL | 0 refills | Status: DC | PRN

## 2019-12-04 ENCOUNTER — Encounter: Payer: Self-pay | Admitting: Family Medicine

## 2019-12-21 ENCOUNTER — Encounter: Payer: Self-pay | Admitting: Family Medicine

## 2020-01-12 ENCOUNTER — Encounter: Payer: Self-pay | Admitting: Internal Medicine

## 2020-01-16 ENCOUNTER — Other Ambulatory Visit: Payer: Self-pay

## 2020-01-16 DIAGNOSIS — F418 Other specified anxiety disorders: Secondary | ICD-10-CM

## 2020-01-16 MED ORDER — SERTRALINE HCL 50 MG PO TABS: ORAL_TABLET | ORAL | 0 refills | Status: DC

## 2020-01-19 ENCOUNTER — Other Ambulatory Visit: Payer: Self-pay | Admitting: Emergency Medicine

## 2020-01-19 DIAGNOSIS — F418 Other specified anxiety disorders: Secondary | ICD-10-CM

## 2020-01-19 MED ORDER — SERTRALINE HCL 50 MG PO TABS: ORAL_TABLET | ORAL | 3 refills | Status: DC

## 2020-02-09 ENCOUNTER — Ambulatory Visit (INDEPENDENT_AMBULATORY_CARE_PROVIDER_SITE_OTHER): Payer: No Typology Code available for payment source | Admitting: Family Medicine

## 2020-02-09 ENCOUNTER — Encounter: Payer: Self-pay | Admitting: Family Medicine

## 2020-02-09 ENCOUNTER — Other Ambulatory Visit: Payer: Self-pay

## 2020-02-09 ENCOUNTER — Other Ambulatory Visit (HOSPITAL_COMMUNITY)
Admission: RE | Admit: 2020-02-09 | Discharge: 2020-02-09 | Disposition: A | Discharge status: Home / Self Care | Payer: No Typology Code available for payment source | Source: Ambulatory Visit | Attending: Family Medicine | Admitting: Family Medicine

## 2020-02-09 VITALS — BP 120/70 | HR 88 | Temp 98.1°F | Resp 14 | Ht 62.0 in | Wt 155.7 lb

## 2020-02-09 DIAGNOSIS — Z5181 Encounter for therapeutic drug level monitoring: Secondary | ICD-10-CM

## 2020-02-09 DIAGNOSIS — Z0000 Encounter for general adult medical examination without abnormal findings: Secondary | ICD-10-CM | POA: Diagnosis not present

## 2020-02-09 DIAGNOSIS — Z1159 Encounter for screening for other viral diseases: Secondary | ICD-10-CM

## 2020-02-09 DIAGNOSIS — F418 Other specified anxiety disorders: Secondary | ICD-10-CM

## 2020-02-09 DIAGNOSIS — F4321 Adjustment disorder with depressed mood: Secondary | ICD-10-CM | POA: Diagnosis not present

## 2020-02-09 DIAGNOSIS — Z1231 Encounter for screening mammogram for malignant neoplasm of breast: Secondary | ICD-10-CM

## 2020-02-09 DIAGNOSIS — F4320 Adjustment disorder, unspecified: Secondary | ICD-10-CM

## 2020-02-09 DIAGNOSIS — Z30013 Encounter for initial prescription of injectable contraceptive: Secondary | ICD-10-CM

## 2020-02-09 DIAGNOSIS — F3341 Major depressive disorder, recurrent, in partial remission: Secondary | ICD-10-CM | POA: Insufficient documentation

## 2020-02-09 DIAGNOSIS — Z1211 Encounter for screening for malignant neoplasm of colon: Secondary | ICD-10-CM

## 2020-02-09 DIAGNOSIS — E039 Hypothyroidism, unspecified: Secondary | ICD-10-CM

## 2020-02-09 DIAGNOSIS — Z124 Encounter for screening for malignant neoplasm of cervix: Secondary | ICD-10-CM | POA: Insufficient documentation

## 2020-02-09 DIAGNOSIS — G4709 Other insomnia: Secondary | ICD-10-CM

## 2020-02-09 MED ORDER — MEDROXYPROGESTERONE ACETATE 150 MG/ML IM SUSP: 150.0000 mg | INTRAMUSCULAR | 4 refills | Status: DC

## 2020-02-09 MED ORDER — SERTRALINE HCL 50 MG PO TABS: 100.0000 mg | ORAL_TABLET | Freq: Every day | ORAL | 3 refills | Status: DC

## 2020-02-09 MED ORDER — BUPROPION HCL ER (XL) 300 MG PO TB24: 300.0000 mg | ORAL_TABLET | Freq: Every day | ORAL | 3 refills | Status: DC

## 2020-02-09 NOTE — Patient Instructions (Addendum)
[facility] [address] [place],  [place]  [address] Get Driving Directions Main: [phone]    Preventive Care 40-46 Years Old, Female Preventive care refers to visits with your health care provider and lifestyle choices that can promote health and wellness. This includes:  A yearly physical exam. This may also be called an annual well check.  Regular dental visits and eye exams.  Immunizations.  Screening for certain conditions.  Healthy lifestyle choices, such as eating a healthy diet, getting regular exercise, not using drugs or products that contain nicotine and tobacco, and limiting alcohol use. What can I expect for my preventive care visit? Physical exam Your health care provider will check your:  Height and weight. This may be used to calculate body mass index (BMI), which tells if you are at a healthy weight.  Heart rate and blood pressure.  Skin for abnormal spots. Counseling Your health care provider may ask you questions about your:  Alcohol, tobacco, and drug use.  Emotional well-being.  Home and relationship well-being.  Sexual activity.  Eating habits.  Work and work environment.  Method of birth control.  Menstrual cycle.  Pregnancy history. What immunizations do I need?  Influenza (flu) vaccine  This is recommended every year. Tetanus, diphtheria, and pertussis (Tdap) vaccine  You may need a Td booster every 10 years. Varicella (chickenpox) vaccine  You may need this if you have not been vaccinated. Zoster (shingles) vaccine  You may need this after age 60. Measles, mumps, and rubella (MMR) vaccine  You may need at least one dose of MMR if you were born in 1957 or later. You may also need a second dose. Pneumococcal conjugate (PCV13) vaccine  You may need this if you have certain conditions and were not previously vaccinated. Pneumococcal polysaccharide (PPSV23) vaccine  You may need one  or two doses if you smoke cigarettes or if you have certain conditions. Meningococcal conjugate (MenACWY) vaccine  You may need this if you have certain conditions. Hepatitis A vaccine  You may need this if you have certain conditions or if you travel or work in places where you may be exposed to hepatitis A. Hepatitis B vaccine  You may need this if you have certain conditions or if you travel or work in places where you may be exposed to hepatitis B. Haemophilus influenzae type b (Hib) vaccine  You may need this if you have certain conditions. Human papillomavirus (HPV) vaccine  If recommended by your health care provider, you may need three doses over 6 months. You may receive vaccines as individual doses or as more than one vaccine together in one shot (combination vaccines). Talk with your health care provider about the risks and benefits of combination vaccines. What tests do I need? Blood tests  Lipid and cholesterol levels. These may be checked every 5 years, or more frequently if you are over 50 years old.  Hepatitis C test.  Hepatitis B test. Screening  Lung cancer screening. You may have this screening every year starting at age 55 if you have a 30-pack-year history of smoking and currently smoke or have quit within the past 15 years.  Colorectal cancer screening. All adults should have this screening starting at age 50 and continuing until age 75. Your health care provider may recommend screening at age 45 if you are at increased risk. You will have tests every 1-10 years, depending on your results and the type of screening test.    Diabetes screening. This is done by checking your blood sugar (glucose) after you have not eaten for a while (fasting). You may have this done every 1-3 years.  Mammogram. This may be done every 1-2 years. Talk with your health care provider about when you should start having regular mammograms. This may depend on whether you have a family  history of breast cancer.  BRCA-related cancer screening. This may be done if you have a family history of breast, ovarian, tubal, or peritoneal cancers.  Pelvic exam and Pap test. This may be done every 3 years starting at age 21. Starting at age 30, this may be done every 5 years if you have a Pap test in combination with an HPV test. Other tests  Sexually transmitted disease (STD) testing.  Bone density scan. This is done to screen for osteoporosis. You may have this scan if you are at high risk for osteoporosis. Follow these instructions at home: Eating and drinking  Eat a diet that includes fresh fruits and vegetables, whole grains, lean protein, and low-fat dairy.  Take vitamin and mineral supplements as recommended by your health care provider.  Do not drink alcohol if: ? Your health care provider tells you not to drink. ? You are pregnant, may be pregnant, or are planning to become pregnant.  If you drink alcohol: ? Limit how much you have to 0-1 drink a day. ? Be aware of how much alcohol is in your drink. In the [place] one drink equals one 12 oz bottle of beer (355 mL), one 5 oz glass of wine (148 mL), or one 1 oz glass of hard liquor (44 mL). Lifestyle  Take daily care of your teeth and gums.  Stay active. Exercise for at least 30 minutes on 5 or more days each week.  Do not use any products that contain nicotine or tobacco, such as cigarettes, e-cigarettes, and chewing tobacco. If you need help quitting, ask your health care provider.  If you are sexually active, practice safe sex. Use a condom or other form of birth control (contraception) in order to prevent pregnancy and STIs (sexually transmitted infections).  If told by your health care provider, take low-dose aspirin daily starting at age 50. What's next?  Visit your health care provider once a year for a well check visit.  Ask your health care provider how often you should have your eyes and teeth  checked.  Stay up to date on all vaccines. This information is not intended to replace advice given to you by your health care provider. Make sure you discuss any questions you have with your health care provider. Document Revised: 03/31/2018 Document Reviewed: 03/31/2018 Elsevier Patient Education  2020 [employer]  

## 2020-02-09 NOTE — Progress Notes (Signed)
[name], Female    DOB: 02-04-1976, 44 y.o.   MRN: [id number] Sheri Berry, PA-C Visit Date: 02/09/2020  Today's Provider: Danelle Berry, PA-C   Chief Complaint  Patient presents with  . Annual Exam    with pap but still having light bleeding?  Marland Kitchen Contraception    talk about getting IUD   Subjective:   Annual physical exam:  Sheri Garrison is a 44 y.o. female who presents today for complete physical exam:  Exercise/Activity:  Not exercisng a lot right now, working a lot a lot of computer work 45-55 hours Diet/nutrition:  Watches calorie intake  Sleep:  Sleeping better  2 kids - boys 16 13    Recent death of her husband (07-13-2019- sudden. Massive MI w/o any med history of CAD) she was struggling with insomnia in the following months, OV reviewed today from around that time:  Last plan per Maurice Small PCP as follows: 1. Grief reaction - Receiving counseling through church and formal counselor weekly. 2. Major depressive disorder, recurrent episode, in partial remission with anxious distress (HCC) 3. Other insomnia Will trial hydroxyzine for 3-4 days, if not effective, we will switch to seroquel  QHS after discussing risk/benefit of this vs trazodone. She is taking Zoloft  and Wellbutrin daily.  If hydroxyzine is working for sleep, but her anxiety and depression symptoms are still persistent, we will increase zoloft to .  She is doing much better with her moods and insomnia.  Her children have been very supportive and affectionate, she started to feel like things got a little easier over the past 3-4 months, she had great family, friends and church family and support that has been helpful.  She did not try the hydroxyzine very much and is sleeping fairly well.  She is taking Zoloft 100 mg right now and has continued to take Wellbutrin daily.  She does not feel ready to change any medicines and does not feel like she needs any additional meds to help at  this time.  Inquiring about birth control, she has started to date an old boyfriend, she has regular monthly periods and has not been on any birth control in a long time, she is considering IUD versus other forms of birth control.  Discussion today about various options, she would like to try Depo-Provera.  Still interested in IUD but does not feel like she has a lot of time right now to get a another doctor's appointment at the OB/GYN for IUD and later for IUD insertion.  Family history of breast cancer, mother was diagnosed when she was in her 23s, negative genetic testing, patient has done her mammograms and they have been negative.  Patient has never had blood clots, her blood pressures well controlled, she is never had a DVT or PE.  She has never had an abnormal Pap  Hypothyroidism: Current Medication Regimen: 150 mcg synthroid Current Symptoms: denies fatigue, weight changes, heat/cold intolerance, bowel/skin changes or CVS symptoms Most recent results are below; we will be repeating labs today, history of varying TSH, often is abnormally high or low, will recheck today and refill meds accordingly Lab Results  Component Value Date   TSH 3.58 08/09/2019   T4TOTAL 7.3 10/08/2017   Hx of HLD, ASCVD risk score is low, because of what happened to her husband she does want everything checked thoroughly to make sure that she is well, she has lost some weight.   USPSTF grade A  and B recommendations - reviewed and addressed today  Depression:  Phq 9 completed today by patient, was reviewed by me with patient in the room PHQ score is neg, pt feels moods are good PHQ 2/9 Scores 02/09/2020 08/28/2019 05/12/2019 03/30/2019  PHQ - 2 Score 0 4 2 3   PHQ- 9 Score 0 15 4 8    [facility] 02/09/2020 08/28/2019 05/12/2019 03/30/2019 11/29/2018  Decreased Interest 0 1 1 2  0  Down, Depressed, Hopeless 0 3 1 1  0  PHQ - 2 Score 0 4 2 3  0  Altered sleeping 0 3 1 0 0  Tired, decreased energy 0 3 1 3  0   Change in appetite 0 1 0 0 0  Feeling bad or failure about yourself  0 0 0 1 0  Trouble concentrating 0 3 0 1 0  Moving slowly or fidgety/restless 0 1 0 0 0  Suicidal thoughts 0 0 0 0 0  PHQ-9 Score 0 15 4 8  0  Difficult doing work/chores Not difficult at all Somewhat difficult Not difficult at all Somewhat difficult Not difficult at all    Alcohol screening:   [facility]  AUDIT-C Score 0     Immunizations and Health Maintenance: Health Maintenance  Topic Date Due  . Hepatitis C Screening  Never done  . PAP SMEAR-Modifier  09/03/2016  . MAMMOGRAM  03/22/2019  . INFLUENZA VACCINE  03/03/2020  . TETANUS/TDAP  04/29/2028  . HIV Screening  Completed    Hep C Screening: due  STD testing and prevention (HIV/chl/gon/syphilis):  see above, no additional testing desired by pt today - none needed, asked and declined  Intimate partner violence: no, she feels safe at home  Sexual History/Pain during Intercourse: recently widowed  Menstrual History/LMP/Abnormal Bleeding: regular menses, no heavy bleeding  Patient's last menstrual period was 02/05/2020.  Incontinence Symptoms: none  Breast cancer:   Due for mammogram Mom and maternal aunt with Breast CA neg genetic testing  Cervical cancer screening: due for PAP/HPV Pt denies family hx of cancers - ovarian, uterine, colon:     Osteoporosis:   Discussion on osteoporosis per age, including high calcium and vitamin D supplementation, weight bearing exercises   Skin cancer:  Hx of skin CA -  NO Discussed atypical lesions   Colorectal cancer:   Colonoscopy is due for age, pt does not feel like she has time for colonoscopy, but accepted referral - encouraged her to connect and schedule later in the year if ableto Discussed concerning signs and sx of CRC, pt denies   Lung cancer:   Low Dose CT Chest recommended if Age 23-80 years, 20 pack-year currently smoking OR have quit w/in  15years. Patient does not qualify.    Social History   Tobacco Use  . Smoking status: Never Smoker  . Smokeless tobacco: Never Used  Vaping Use  . Vaping Use: Never used  Substance Use Topics  . Alcohol use: Yes    Alcohol/week: 0.0 standard drinks    Comment: occasionally  . Drug use: No       [facility]  AUDIT-C Score 0      Family History  Problem Relation Age of Onset  . Heart disease Mother   . Hyperlipidemia Mother   . Hypertension Mother   . Breast cancer Mother [id number]  . Hearing loss Father        due to age  .  Arthritis Maternal Grandmother   . Hyperlipidemia Maternal Grandmother   . Hypertension Maternal Grandmother   . Dementia Maternal Grandmother   . Heart disease Maternal Grandfather   . Hyperlipidemia Maternal Grandfather   . Hypertension Maternal Grandfather   . Stroke Paternal Grandmother   . Heart disease Paternal Grandfather   . Depression Maternal Aunt   . Hyperlipidemia Maternal Aunt   . Hypertension Maternal Aunt   . Breast cancer Maternal Aunt        50's     Blood pressure/Hypertension: BP Readings from Last 3 Encounters:  02/09/20 120/70  03/30/19 118/70  04/29/18 110/70    Weight/Obesity: Wt Readings from Last 3 Encounters:  02/09/20 155 lb 11.2 oz (70.6 kg)  08/28/19 154 lb (69.9 kg)  03/30/19 171 lb 8 oz (77.8 kg)   BMI Readings from Last 3 Encounters:  02/09/20 28.48 kg/m  08/28/19 28.17 kg/m  03/30/19 31.37 kg/m     Lipids:  Lab Results  Component Value Date   CHOL 203 (H) 03/23/2017   CHOL 173 04/15/2015   Lab Results  Component Value Date   HDL 74 03/23/2017   HDL 65 04/15/2015   Lab Results  Component Value Date   LDLCALC 112 (H) 03/23/2017   LDLCALC 90 04/15/2015   Lab Results  Component Value Date   TRIG 83 03/23/2017   TRIG 88 04/15/2015   Lab Results  Component Value Date   CHOLHDL 2.7 03/23/2017   CHOLHDL 2.7 04/15/2015   No results found  for: LDLDIRECT Based on the results of lipid panel his/her cardiovascular risk factor ( using [place] Cohort )  in the next 10 years is: The 10-year ASCVD risk score Denman George DC Montez Hageman., et al., 2013) is: 0.5%   Values used to calculate the score:     Age: 23 years     Sex: Female     Is Non-Hispanic African American: No     Diabetic: No     Tobacco smoker: No     Systolic Blood Pressure: 120 mmHg     Is BP treated: No     HDL Cholesterol: 74 mg/dL     Total Cholesterol: 203 mg/dL Glucose:  Glucose  Date Value Ref Range Status  04/15/2015 81 65 - 99 mg/dL Final   Glucose, Bld  Date Value Ref Range Status  04/29/2018 78 65 - 139 mg/dL Final    Comment:    .        Non-fasting reference interval .   03/23/2017 86 65 - 99 mg/dL Final   Hypertension: BP Readings from Last 3 Encounters:  02/09/20 120/70  03/30/19 118/70  04/29/18 110/70   Obesity: Wt Readings from Last 3 Encounters:  02/09/20 155 lb 11.2 oz (70.6 kg)  08/28/19 154 lb (69.9 kg)  03/30/19 171 lb 8 oz (77.8 kg)   BMI Readings from Last 3 Encounters:  02/09/20 28.48 kg/m  08/28/19 28.17 kg/m  03/30/19 31.37 kg/m      Advanced Care Planning:  A voluntary discussion about advance care planning including the explanation and discussion of advance directives.   Discussed health care proxy and Living will, packet given.    Social History      She        Social History   Socioeconomic History  . Marital status: Widowed    Spouse name: Not on file  . Number of children: Not on file  . Years of education: Not on file  . Highest education level:  Not on file  Occupational History  . Not on file  Tobacco Use  . Smoking status: Never Smoker  . Smokeless tobacco: Never Used  Vaping Use  . Vaping Use: Never used  Substance and Sexual Activity  . Alcohol use: Yes    Alcohol/week: 0.0 standard drinks    Comment: occasionally  . Drug use: No  . Sexual activity: Not Currently    Partners: Male    Birth  control/protection: None  Other Topics Concern  . Not on file  Social History Narrative   Husband passed away suddenly 08-06-19   Social Determinants of Health   Financial Resource Strain:   . Difficulty of Paying Living Expenses:   Food Insecurity:   . Worried About Programme researcher, broadcasting/film/video in the Last Year:   . Barista in the Last Year:   Transportation Needs:   . Freight forwarder (Medical):   Marland Kitchen Lack of Transportation (Non-Medical):   Physical Activity:   . Days of Exercise per Week:   . Minutes of Exercise per Session:   Stress:   . Feeling of Stress :   Social Connections:   . Frequency of Communication with Friends and Family:   . Frequency of Social Gatherings with Friends and Family:   . Attends Religious Services:   . Active Member of Clubs or Organizations:   . Attends Banker Meetings:   Marland Kitchen Marital Status:     Family History        Family History  Problem Relation Age of Onset  . Heart disease Mother   . Hyperlipidemia Mother   . Hypertension Mother   . Breast cancer Mother 34  . Hearing loss Father        due to age  . Arthritis Maternal Grandmother   . Hyperlipidemia Maternal Grandmother   . Hypertension Maternal Grandmother   . Dementia Maternal Grandmother   . Heart disease Maternal Grandfather   . Hyperlipidemia Maternal Grandfather   . Hypertension Maternal Grandfather   . Stroke Paternal Grandmother   . Heart disease Paternal Grandfather   . Depression Maternal Aunt   . Hyperlipidemia Maternal Aunt   . Hypertension Maternal Aunt   . Breast cancer Maternal Aunt        50's    Patient Active Problem List   Diagnosis Date Noted  . Other insomnia 02/09/2020  . Grief reaction 02/09/2020  . Major depressive disorder, recurrent episode, in partial remission with anxious distress (HCC) 02/09/2020  . Iron deficiency anemia 03/23/2017  . Depression with anxiety 06/04/2016  . Anti-TPO antibodies present 01/14/2016  . Panic  disorder without agoraphobia 01/03/2016  . Hypothyroidism, adult 04/09/2015    Past Surgical History:  Procedure Laterality Date  . CHOLECYSTECTOMY  2004  . LAPAROSCOPIC GASTRIC SLEEVE RESECTION  2014     Current Outpatient Medications:  .  buPROPion (WELLBUTRIN XL) 300 MG 24 hr tablet, TAKE 1 TABLET BY MOUTH  DAILY, Disp: 90 tablet, Rfl: 3 .  levothyroxine (SYNTHROID) 150 MCG tablet, Take 1 tablet (150 mcg total) by mouth daily before breakfast., Disp: 90 tablet, Rfl: 1 .  Multiple Vitamin (MULTIVITAMIN) tablet, Take 1 tablet by mouth daily., Disp: , Rfl:  .  sertraline (ZOLOFT) 50 MG tablet, Take 2 tablets (100 mg total) by mouth daily., Disp: 180 tablet, Rfl: 3 .  medroxyPROGESTERone (DEPO-PROVERA) 150 MG/ML injection, Inject 1 mL (150 mg total) into the muscle every 3 (three) months., Disp: 1 mL, Rfl: 4  Allergies  Allergen Reactions  . Alka-Seltzer Plus Cold [Chlorphen-Phenyleph-Asa] Swelling  . Tessalon [Benzonatate] Hives and Swelling    Patient Care Team: Sheri Berry, PA-C as PCP - General (Family Medicine)  Review of Systems  Constitutional: Negative.   HENT: Negative.   Eyes: Negative.   Respiratory: Negative.   Cardiovascular: Negative.   Gastrointestinal: Negative.   Endocrine: Negative.   Genitourinary: Negative.   Musculoskeletal: Negative.   Skin: Negative.   Allergic/Immunologic: Negative.   Neurological: Negative.   Hematological: Negative.   Psychiatric/Behavioral: Negative.   All other systems reviewed and are negative.    I personally reviewed active problem list, medication list, allergies, family history, social history, health maintenance, notes from last encounter, lab results, imaging with the patient/caregiver today.        Objective:   Vitals:  Vitals:   02/09/20 1500  BP: 120/70  Pulse: 88  Resp: 14  Temp: 98.1 F (36.7 C)  SpO2: 94%  Weight: 155 lb 11.2 oz (70.6 kg)  Height: 5\' 2"  (1.575 m)    Body mass index is 28.48  kg/m.  Physical Exam Vitals and nursing note reviewed. Exam conducted with a chaperone present.  Constitutional:      General: She is not in acute distress.    Appearance: Normal appearance. She is well-developed. She is not ill-appearing, toxic-appearing or diaphoretic.     Interventions: Face mask in place.  HENT:     Head: Normocephalic and atraumatic.     Right Ear: External ear normal.     Left Ear: External ear normal.  Eyes:     General: Lids are normal. No scleral icterus.       Right eye: No discharge.        Left eye: No discharge.     Conjunctiva/sclera: Conjunctivae normal.  Neck:     Trachea: Phonation normal. No tracheal deviation.  Cardiovascular:     Rate and Rhythm: Normal rate and regular rhythm.     Pulses: Normal pulses.          Radial pulses are 2+ on the right side and 2+ on the left side.       Posterior tibial pulses are 2+ on the right side and 2+ on the left side.     Heart sounds: Normal heart sounds. No murmur heard.  No friction rub. No gallop.   Pulmonary:     Effort: Pulmonary effort is normal. No respiratory distress.     Breath sounds: Normal breath sounds. No stridor. No wheezing, rhonchi or rales.  Chest:     Chest wall: No tenderness.     Breasts:        Right: Normal. No swelling, bleeding, inverted nipple, mass, nipple discharge, skin change or tenderness.        Left: Normal. No swelling, bleeding, inverted nipple, mass, nipple discharge, skin change or tenderness.  Abdominal:     General: Bowel sounds are normal. There is no distension.     Palpations: Abdomen is soft.     Tenderness: There is no abdominal tenderness. There is no guarding or rebound.  Genitourinary:    General: Normal vulva.     Vagina: Normal.     Cervix: Cervical bleeding present. No cervical motion tenderness, discharge, friability, lesion, erythema or eversion.  Musculoskeletal:        General: No deformity. Normal range of motion.     Cervical back: Normal  range of motion and neck supple.     Right  lower leg: No edema.     Left lower leg: No edema.  Lymphadenopathy:     Cervical: No cervical adenopathy.     Upper Body:     Right upper body: No supraclavicular, axillary or pectoral adenopathy.     Left upper body: No supraclavicular, axillary or pectoral adenopathy.  Skin:    General: Skin is warm and dry.     Capillary Refill: Capillary refill takes less than 2 seconds.     Coloration: Skin is not jaundiced or pale.     Findings: Bruising present. No rash.  Neurological:     Mental Status: She is alert and oriented to person, place, and time.     Motor: No abnormal muscle tone.     Gait: Gait normal.  Psychiatric:        Mood and Affect: Mood normal.        Speech: Speech normal.        Behavior: Behavior normal.       Fall Risk: Fall Risk  02/09/2020 08/28/2019 05/12/2019 03/30/2019 11/29/2018  Falls in the past year? 0 0 0 0 0  Number falls in past yr: 0 0 0 0 0  Comment - - - - -  Injury with Fall? 0 0 0 0 0  Follow up - - Falls evaluation completed Falls evaluation completed Falls evaluation completed    Functional Status Survey: Is the patient deaf or have difficulty hearing?: No Does the patient have difficulty seeing, even when wearing glasses/contacts?: No Does the patient have difficulty concentrating, remembering, or making decisions?: No Does the patient have difficulty walking or climbing stairs?: No Does the patient have difficulty dressing or bathing?: No Does the patient have difficulty doing errands alone such as visiting a doctor's office or shopping?: No   Assessment & Plan:    CPE completed today  . USPSTF grade A and B recommendations reviewed with patient; age-appropriate recommendations, preventive care, screening tests, etc discussed and encouraged; healthy living encouraged; see AVS for patient education given to patient  . Discussed importance of 150 minutes of physical activity weekly, AHA exercise  recommendations given to pt in AVS/handout  . Discussed importance of healthy diet:  eating lean meats and proteins, avoiding trans fats and saturated fats, avoid simple sugars and excessive carbs in diet, eat 6 servings of fruit/vegetables daily and drink plenty of water and avoid sweet beverages.    . Recommended pt to do annual eye exam and routine dental exams/cleanings  . Depression, alcohol, fall screening completed as documented above and per flowsheets  . Reviewed Health Maintenance: Health Maintenance  Topic Date Due  . Hepatitis C Screening  Never done  . PAP SMEAR-Modifier  09/03/2016  . MAMMOGRAM  03/22/2019  . INFLUENZA VACCINE  03/03/2020  . TETANUS/TDAP  04/29/2028  . HIV Screening  Completed    . Immunizations: Immunization History  Administered Date(s) Administered  . Influenza,inj,Quad PF,6+ Mos 08/28/2016, 04/29/2018  . Tdap 04/29/2018        ICD-10-CM   1. Adult general medical exam  Z00.00 CBC with Differential/Platelet    Lipid panel    TSH    Hepatitis C antibody    COMPLETE METABOLIC PANEL WITH GFR  2. Encounter for medication monitoring  Z51.81 CBC with Differential/Platelet    Lipid panel    TSH  3. Grief reaction  F43.21    doing better, great support system  4. Major depressive disorder, recurrent episode, in partial remission with anxious distress (  HCC)  F33.41    F41.8    phq neg, sx well controlled currently with increased zoloft dose 100 mg and wellbutrin  5. Hypothyroidism, adult  E03.9 TSH   recheck TSH, refill meds per lab results  6. Other insomnia  G47.09    improved  7. Encounter for screening mammogram for malignant neoplasm of breast  Z12.31 MM 3D SCREEN BREAST BILATERAL  8. Screening for malignant neoplasm of cervix  Z12.4 Cytology - PAP  9. Screening for malignant neoplasm of colon  Z12.11 Ambulatory referral to Gastroenterology  10. Encounter for hepatitis C screening test for low risk patient  Z11.59 Hepatitis C antibody    11. Depression with anxiety  F41.8 sertraline (ZOLOFT) 50 MG tablet  12. Encounter for initial prescription of injectable contraceptive  Z30.013 medroxyPROGESTERone (DEPO-PROVERA) 150 MG/ML injection   long discussion of options, SE, risks, looked up CDC and uptodate pt has no contraindications.  may want IUD later    Education given regarding options for contraception, including barrier methods, injectable contraception, IUD placement, oral contraceptives.  Pt decided to start depo-provera injectable - explained 13 week/3 month f/up nurse visits for injection.   Have researched - off label there is use of SQ depo for pt to self administer- I do not know if its available.   Discussed possible SE with change to menses, monitor weight - looked at pharmacology of depo-shot.  All questions asked and answered     Sheri Garrison, Cordelia Poche 02/09/20 5:36 PM  [facility]

## 2020-02-12 ENCOUNTER — Other Ambulatory Visit: Payer: Self-pay | Admitting: Family Medicine

## 2020-02-12 DIAGNOSIS — E039 Hypothyroidism, unspecified: Secondary | ICD-10-CM

## 2020-02-12 LAB — COMPLETE METABOLIC PANEL WITH GFR
AG Ratio: 1.7 (calc) (ref 1.0–2.5)
ALT: 17 U/L (ref 6–29)
AST: 19 U/L (ref 10–35)
Albumin: 4.8 g/dL (ref 3.6–5.1)
Alkaline phosphatase (APISO): 74 U/L (ref 31–125)
BUN: 16 mg/dL (ref 7–25)
CO2: 27 mmol/L (ref 20–32)
Calcium: 9.9 mg/dL (ref 8.6–10.2)
Chloride: 102 mmol/L (ref 98–110)
Creat: 0.88 mg/dL (ref 0.50–1.10)
GFR, Est African American: 91 mL/min/{1.73_m2} (ref >=60)
GFR, Est Non African American: 79 mL/min/{1.73_m2} (ref >=60)
Globulin: 2.9 g/dL (calc) (ref 1.9–3.7)
Glucose, Bld: 79 mg/dL (ref 65–99)
Potassium: 4.3 mmol/L (ref 3.5–5.3)
Sodium: 138 mmol/L (ref 135–146)
Total Bilirubin: 0.6 mg/dL (ref 0.2–1.2)
Total Protein: 7.7 g/dL (ref 6.1–8.1)

## 2020-02-12 LAB — TSH: TSH: 2.84 mIU/L

## 2020-02-12 LAB — LIPID PANEL
Cholesterol: 229 mg/dL — ABNORMAL HIGH (ref <200)
HDL: 87 mg/dL (ref >=50)
LDL Cholesterol (Calc): 122 mg/dL (calc) — ABNORMAL HIGH
Non-HDL Cholesterol (Calc): 142 mg/dL (calc) — ABNORMAL HIGH (ref <130)
Total CHOL/HDL Ratio: 2.6 (calc) (ref <5.0)
Triglycerides: 92 mg/dL (ref <150)

## 2020-02-12 LAB — CBC WITH DIFFERENTIAL/PLATELET
Absolute Monocytes: 521 cells/uL (ref 200–950)
Basophils Absolute: 102 cells/uL (ref 0–200)
Basophils Relative: 1.1 %
Eosinophils Absolute: 298 cells/uL (ref 15–500)
Eosinophils Relative: 3.2 %
HCT: 41.4 % (ref 35.0–45.0)
Hemoglobin: 13.8 g/dL (ref 11.7–15.5)
Lymphs Abs: 2251 cells/uL (ref 850–3900)
MCH: 29.4 pg (ref 27.0–33.0)
MCHC: 33.3 g/dL (ref 32.0–36.0)
MCV: 88.1 fL (ref 80.0–100.0)
MPV: 9.2 fL (ref 7.5–12.5)
Monocytes Relative: 5.6 %
Neutro Abs: 6129 cells/uL (ref 1500–7800)
Neutrophils Relative %: 65.9 %
Platelets: 527 10*3/uL — ABNORMAL HIGH (ref 140–400)
RBC: 4.7 10*6/uL (ref 3.80–5.10)
RDW: 13 % (ref 11.0–15.0)
Total Lymphocyte: 24.2 %
WBC: 9.3 10*3/uL (ref 3.8–10.8)

## 2020-02-12 LAB — HEPATITIS C ANTIBODY
Hepatitis C Ab: NONREACTIVE
SIGNAL TO CUT-OFF: 0.01 (ref <1.00)

## 2020-02-12 MED ORDER — LEVOTHYROXINE SODIUM 150 MCG PO TABS: 150.0000 ug | ORAL_TABLET | Freq: Every day | ORAL | 3 refills | Status: DC

## 2020-02-13 LAB — CYTOLOGY - PAP
Comment: NEGATIVE
Diagnosis: NEGATIVE
High risk HPV: NEGATIVE

## 2020-02-20 ENCOUNTER — Encounter: Payer: Self-pay | Admitting: *Deleted

## 2020-08-16 ENCOUNTER — Ambulatory Visit: Payer: No Typology Code available for payment source | Admitting: Family Medicine

## 2020-08-29 ENCOUNTER — Ambulatory Visit: Payer: No Typology Code available for payment source | Admitting: Family Medicine

## 2020-09-25 ENCOUNTER — Other Ambulatory Visit: Payer: Self-pay

## 2020-09-25 ENCOUNTER — Encounter: Payer: Self-pay | Admitting: Family Medicine

## 2020-09-25 ENCOUNTER — Ambulatory Visit (INDEPENDENT_AMBULATORY_CARE_PROVIDER_SITE_OTHER): Payer: No Typology Code available for payment source | Admitting: Family Medicine

## 2020-09-25 VITALS — BP 100/76 | HR 93 | Temp 97.9°F | Resp 16 | Ht 62.0 in | Wt 162.0 lb

## 2020-09-25 DIAGNOSIS — M542 Cervicalgia: Secondary | ICD-10-CM

## 2020-09-25 DIAGNOSIS — E7800 Pure hypercholesterolemia, unspecified: Secondary | ICD-10-CM | POA: Diagnosis not present

## 2020-09-25 DIAGNOSIS — E039 Hypothyroidism, unspecified: Secondary | ICD-10-CM | POA: Diagnosis not present

## 2020-09-25 DIAGNOSIS — D509 Iron deficiency anemia, unspecified: Secondary | ICD-10-CM | POA: Diagnosis not present

## 2020-09-25 DIAGNOSIS — F418 Other specified anxiety disorders: Secondary | ICD-10-CM

## 2020-09-25 LAB — CBC WITH DIFFERENTIAL/PLATELET
Absolute Monocytes: 627 cells/uL (ref 200–950)
Basophils Absolute: 88 cells/uL (ref 0–200)
Basophils Relative: 0.9 %
Eosinophils Absolute: 314 cells/uL (ref 15–500)
Eosinophils Relative: 3.2 %
HCT: 40.3 % (ref 35.0–45.0)
Hemoglobin: 13.8 g/dL (ref 11.7–15.5)
Lymphs Abs: 2528 cells/uL (ref 850–3900)
MCH: 30.8 pg (ref 27.0–33.0)
MCHC: 34.2 g/dL (ref 32.0–36.0)
MCV: 90 fL (ref 80.0–100.0)
MPV: 9 fL (ref 7.5–12.5)
Monocytes Relative: 6.4 %
Neutro Abs: 6243 cells/uL (ref 1500–7800)
Neutrophils Relative %: 63.7 %
Platelets: 442 10*3/uL — ABNORMAL HIGH (ref 140–400)
RBC: 4.48 10*6/uL (ref 3.80–5.10)
RDW: 11.9 % (ref 11.0–15.0)
Total Lymphocyte: 25.8 %
WBC: 9.8 10*3/uL (ref 3.8–10.8)

## 2020-09-25 LAB — LIPID PANEL
Cholesterol: 156 mg/dL (ref <200)
HDL: 54 mg/dL (ref >=50)
LDL Cholesterol (Calc): 77 mg/dL (calc)
Non-HDL Cholesterol (Calc): 102 mg/dL (calc) (ref <130)
Total CHOL/HDL Ratio: 2.9 (calc) (ref <5.0)
Triglycerides: 156 mg/dL — ABNORMAL HIGH (ref <150)

## 2020-09-25 LAB — TSH: TSH: 0.29 mIU/L — ABNORMAL LOW

## 2020-09-25 NOTE — Patient Instructions (Signed)
It was great to see you!  Our plans for today:  - No changes to your medications today.  - Check with your insurance if you are interested in the Cologuard testing for screening for colon cancer.  - Call to schedule your mammogram soon.   - Follow up in 6 months for your annual visit or sooner if needed.  We are checking some labs today, we will release these results to your MyChart.  Take care and seek immediate care sooner if you develop any concerns.   Dr. Linwood Dibbles

## 2020-09-25 NOTE — Assessment & Plan Note (Signed)
Currently asymptomatic. Rechecking labs today.

## 2020-09-25 NOTE — Assessment & Plan Note (Signed)
Doing well with home stretches and prn muscle relaxer, s/p steroid course. Continue current regimen and f/u prn.

## 2020-09-25 NOTE — Progress Notes (Signed)
° ° °  SUBJECTIVE:   CHIEF COMPLAINT / HPI:   Patient Active Problem List   Diagnosis Date Noted   Neck pain 09/25/2020   Other insomnia 02/09/2020   Grief reaction 02/09/2020   Iron deficiency anemia 03/23/2017   Depression with anxiety 06/04/2016   Anti-TPO antibodies present 01/14/2016   Panic disorder without agoraphobia 01/03/2016   Hypothyroidism, adult 04/09/2015   Pulled muscle - saw [facility] 2/16 for neck muscle strain, rx medrol dose pack and muscle relaxer, home exercise program. - have used heating pad and stretches.  - pain and ROM improved  Hypothyroidism - Medications: Synthroid - Current symptoms:  none - Denies change in energy level and diarrhea, palpitations, nervousness - Symptoms have been well-controlled  Anxiety/Depression - Medications: zoloft 100mg  daily, wellbutrin 300mg  daily - Taking: none - Counseling: not currently - Previous hospitalizations: no - Symptoms: none - Current stressors: moved recently but doing well   OBJECTIVE:   BP 100/76    Pulse 93    Temp 97.9 F (36.6 C) (Oral)    Resp 16    Ht 5\' 2"  (1.575 m)    Wt 162 lb (73.5 kg)    SpO2 97%    BMI 29.63 kg/m   Gen: well appearing, in NAD HEENT: no thyromegaly Card: RRR Lungs: CTAB Ext: WWP  ASSESSMENT/PLAN:   Hypothyroidism, adult Asymptomatic. Rechecking labs today, will adjust as indicated.   Iron deficiency anemia Currently asymptomatic. Rechecking labs today.  Depression with anxiety Doing well on current regimen, no changes made today.   Neck pain Doing well with home stretches and prn muscle relaxer, s/p steroid course. Continue current regimen and f/u prn.   , DO

## 2020-09-25 NOTE — Assessment & Plan Note (Signed)
Doing well on current regimen, no changes made today. 

## 2020-09-25 NOTE — Assessment & Plan Note (Signed)
Asymptomatic. Rechecking labs today, will adjust as indicated.

## 2020-09-26 MED ORDER — LEVOTHYROXINE SODIUM 125 MCG PO TABS: 125.0000 ug | ORAL_TABLET | Freq: Every day | ORAL | 0 refills | Status: DC

## 2020-09-26 NOTE — Addendum Note (Signed)
Addended by: Caro Laroche on: 09/26/2020 11:31 AM   Modules accepted: Orders

## 2021-01-04 ENCOUNTER — Other Ambulatory Visit: Payer: Self-pay | Admitting: Family Medicine

## 2021-01-04 DIAGNOSIS — E039 Hypothyroidism, unspecified: Secondary | ICD-10-CM

## 2021-01-16 ENCOUNTER — Other Ambulatory Visit: Payer: Self-pay | Admitting: Family Medicine

## 2021-01-16 DIAGNOSIS — E039 Hypothyroidism, unspecified: Secondary | ICD-10-CM

## 2021-01-16 DIAGNOSIS — F418 Other specified anxiety disorders: Secondary | ICD-10-CM

## 2021-01-16 MED ORDER — BUPROPION HCL ER (XL) 300 MG PO TB24: 300.0000 mg | ORAL_TABLET | Freq: Every day | ORAL | 1 refills | Status: DC

## 2021-01-28 ENCOUNTER — Ambulatory Visit (INDEPENDENT_AMBULATORY_CARE_PROVIDER_SITE_OTHER): Payer: No Typology Code available for payment source | Admitting: Family Medicine

## 2021-01-28 ENCOUNTER — Other Ambulatory Visit: Payer: Self-pay

## 2021-01-28 ENCOUNTER — Encounter: Payer: Self-pay | Admitting: Family Medicine

## 2021-01-28 VITALS — BP 124/78 | HR 77 | Temp 98.1°F | Resp 16 | Ht 62.0 in | Wt 174.8 lb

## 2021-01-28 DIAGNOSIS — T148XXA Other injury of unspecified body region, initial encounter: Secondary | ICD-10-CM

## 2021-01-28 MED ORDER — TIZANIDINE HCL 4 MG PO TABS: 4.0000 mg | ORAL_TABLET | Freq: Four times a day (QID) | ORAL | 0 refills | Status: DC | PRN

## 2021-01-28 NOTE — Patient Instructions (Signed)
It was great to see you!  Our plans for today:  - Try the muscle relaxer for pain. - Try the stretches below. Try a heating pad before stretching if you have pain with stretching.  - Come back if you are still having pain in a few weeks or if the muscle relaxer doesn't help.  Take care and seek immediate care sooner if you develop any concerns.   Dr. Myrlene Broker on your back. Bend your right knee so that your right foot is flat on the floor. Cross your left leg over your right so that your left ankle rests on your right knee. Use your hands to grab hold of your left knee and pull it gently toward the opposite shoulder. You should feel the stretch in your buttocks and hips. Hold for 15 to 30 seconds. Relax, and then repeat with the other leg. Repeat this cycle 2 to 4 times.   Lie on your back in a doorway, with one leg through the open door. Slide your leg up the wall to straighten your knee. You should feel a gentle stretch down the back of your leg. Hold the stretch for at least 1 minute. As the days go by, add a little more time until you can relax and let these muscles stretch for as much as 6 minutes for each leg. Do not arch your back. Do not bend either knee. Keep one heel touching the floor and the other heel touching the wall. Do not point your toes. Repeat with your other leg. Do 2 to 4 times for each leg. If you do not have a place to do this exercise in a doorway, there is another way to do it:  Lie on your back and bend the knee of the leg you want to stretch. Loop a towel under the ball and toes of that foot, and hold the ends of the towel in your hands. Straighten your knee and slowly pull back on the towel. You should feel a gentle stretch down the back of your leg. It is hard to hold this stretch with a towel for a long time, but hold the stretch for at least 15 to 30 seconds. One minute or more is even better. Repeat with your other leg. Do 2 to 4 times for each  leg.   Child's Pose Kneel on the floor with your toes together and your knees hip-width apart. Rest your palms on top of your thighs. On an exhale, lower your torso between your knees. Extend your arms alongside your torso with your palms facing down. Relax your shoulders toward the ground. Rest in the pose for as long as needed.

## 2021-01-28 NOTE — Progress Notes (Signed)
    SUBJECTIVE:   CHIEF COMPLAINT / HPI:   BACK PAIN - started in May after moving boxes. R>L - constant Duration: months Mechanism of injury:  moving boxes Location: bilateral and low back Quality: sharp and dull, cramping Frequency: constant, worsening with certain movements Radiation:  to sides Aggravating factors: standing a lot Alleviating factors: stretching, OTC meds. Status: stable Treatments attempted:  ibuprofen, tylenol Relief with NSAIDs?: moderate Nighttime pain:   sometimes Paresthesias / decreased sensation:  no Bowel / bladder incontinence:  no Fevers:  no Dysuria / urinary frequency:  no   OBJECTIVE:   BP 124/78   Pulse 77   Temp 98.1 F (36.7 C)   Resp 16   Ht 5\' 2"  (1.575 m)   Wt 174 lb 12.8 oz (79.3 kg)   SpO2 99%   BMI 31.97 kg/m   Gen: well appearing, in NAD MSK: full AROM in flexion, extension, sidebending. 5/5 LE strength (quad, hamstring, plantar/dorsiflexion) with intact patellar and achilles reflexes. No tenderness over piriformis.   ASSESSMENT/PLAN:   No problem-specific Assessment & Plan notes found for this encounter. Back pain Subacute, likely 2/2 muscle strain. No red flags. Will provide muscle relaxer, stretching. Continue NSAIDs and heating pad. RTC if no better after a few weeks.     , DO

## 2021-01-30 ENCOUNTER — Other Ambulatory Visit: Payer: Self-pay | Admitting: Family Medicine

## 2021-01-30 DIAGNOSIS — E039 Hypothyroidism, unspecified: Secondary | ICD-10-CM

## 2021-01-30 DIAGNOSIS — F418 Other specified anxiety disorders: Secondary | ICD-10-CM

## 2021-02-04 ENCOUNTER — Other Ambulatory Visit: Payer: Self-pay

## 2021-02-04 DIAGNOSIS — F418 Other specified anxiety disorders: Secondary | ICD-10-CM

## 2021-02-04 MED ORDER — SERTRALINE HCL 50 MG PO TABS: 100.0000 mg | ORAL_TABLET | Freq: Every day | ORAL | 3 refills | Status: DC

## 2021-06-26 ENCOUNTER — Other Ambulatory Visit: Payer: Self-pay | Admitting: Family Medicine

## 2021-06-26 DIAGNOSIS — F418 Other specified anxiety disorders: Secondary | ICD-10-CM

## 2021-06-27 NOTE — Telephone Encounter (Signed)
Requested Prescriptions  Pending Prescriptions Disp Refills  . buPROPion (WELLBUTRIN XL) 300 MG 24 hr tablet [Pharmacy Med Name: buPROPion HCl ER (XL) 300 MG Oral Tablet Extended Release 24 Hour] 90 tablet 1    Sig: TAKE 1 TABLET BY MOUTH  DAILY     Psychiatry: Antidepressants - bupropion Passed - 06/26/2021 11:49 PM      Passed - Completed PHQ-2 or PHQ-9 in the last 360 days      Passed - Last BP in normal range    BP Readings from Last 1 Encounters:  01/28/21 124/78         Passed - Valid encounter within last 6 months    Recent Outpatient Visits          5 months ago Muscle strain   [facility] Caro Laroche, DO   9 months ago Hypothyroidism, adult   [facility] Caro Laroche, DO   1 year ago Adult general medical exam   [facility] Danelle Berry, PA-C   1 year ago Grief reaction   [facility] [place], Gerome Apley, FNP   2 years ago Hypothyroidism, adult   [facility] [place], Gerome Apley, [place]

## 2021-09-05 ENCOUNTER — Telehealth (INDEPENDENT_AMBULATORY_CARE_PROVIDER_SITE_OTHER): Payer: No Typology Code available for payment source | Admitting: Internal Medicine

## 2021-09-05 ENCOUNTER — Telehealth: Payer: Self-pay | Admitting: *Deleted

## 2021-09-05 DIAGNOSIS — F4321 Adjustment disorder with depressed mood: Secondary | ICD-10-CM | POA: Diagnosis not present

## 2021-09-05 DIAGNOSIS — F418 Other specified anxiety disorders: Secondary | ICD-10-CM | POA: Diagnosis not present

## 2021-09-05 MED ORDER — SERTRALINE HCL 100 MG PO TABS: 100.0000 mg | ORAL_TABLET | Freq: Every day | ORAL | 3 refills | Status: DC

## 2021-09-05 MED ORDER — HYDROXYZINE HCL 10 MG PO TABS
10.0000 mg | ORAL_TABLET | Freq: Three times a day (TID) | ORAL | 0 refills | Status: DC | PRN
Start: 2021-09-05 — End: 2021-10-14

## 2021-09-05 MED ORDER — ALPRAZOLAM 0.25 MG PO TABS: 0.2500 mg | ORAL_TABLET | Freq: Two times a day (BID) | ORAL | 0 refills | Status: DC | PRN

## 2021-09-05 NOTE — Chronic Care Management (AMB) (Signed)
°  Care Management   Outreach Note  09/05/2021 Name: ANYSIA LEHANE MRN: [id number] DOB: April 09, 1976  Referred by: Delsa Grana, PA-C Reason for referral : Care Coordination (Initial outreach to schedule referral with Licensed Clinical SW)   An unsuccessful telephone outreach was attempted today. The patient was referred to the case management team for assistance with care management and care coordination.   Follow Up Plan:  If patient returns call to provider office, please advise to call Embedded Care Management Care Guide Conall Vangorder at [place], [place] Management  Direct Dial: [phone]

## 2021-09-05 NOTE — Progress Notes (Signed)
Virtual Visit via Video Note  I connected with Sheri Garrison on 09/05/21 at  8:00 AM EST by a video enabled telemedicine application and verified that I am speaking with the correct person using two identifiers.  Location: Patient: Home Provider: [facility]   I discussed the limitations of evaluation and management by telemedicine and the availability of in person appointments. The patient expressed understanding and agreed to proceed.  History of Present Illness:  Sheri Garrison is a 46 year old female presenting via telemedicine for anxiety. She had a close friend commit suicide last week and has dealing with a lot of grief and panic. Her husband passed away 2 years ago as well. She has been unable to go to work this last week and would like to discuss medications to help her.  Anxiety: -Duration:exacerbated -Anxious mood: yes  -Excessive worrying: yes -Irritability: yes  -Sweating: yes -Nausea: yes -Palpitations:no -Panic attacks: yes -Depressed mood: yes Depression screen [facility] 2/9 09/05/2021 01/28/2021 09/25/2020 02/09/2020 08/28/2019  Decreased Interest 3 0 0 0 1  Down, Depressed, Hopeless 3 0 0 0 3  PHQ - 2 Score 6 0 0 0 4  Altered sleeping 3 0 0 0 3  Tired, decreased energy 3 0 1 0 3  Change in appetite 3 0 0 0 1  Feeling bad or failure about yourself  3 0 0 0 0  Trouble concentrating 3 0 0 0 3  Moving slowly or fidgety/restless 0 0 0 0 1  Suicidal thoughts 0 0 0 0 0  PHQ-9 Score 21 0 1 0 15  Difficult doing work/chores Extremely dIfficult Not difficult at all Not difficult at all Not difficult at all Somewhat difficult  Some recent data might be hidden   -Insomnia: yes  both   -Hypersomnia: no -Fatigue/loss of energy: yes -Suicidal ideations: no  -Crying spells: yes -Recent Stressors/Life Changes: yes -Recent death/loss: yes -Current Treatments: Zoloft 50, Wellbutrin 300 -Patient is compliant with the above medications at above dose and reports no side effects.  -Counseling: Not  currently but interested  Observations/Objective:  General: in no acute distress Neuro: answers all questions appropriately Psych: tearful  Assessment and Plan:  1. Grief/Depression with anxiety: Discussed using a short acting medication for a short amount of time, then switching to Hydroxyzine as needed. Zoloft increased to 100 mg as well. Referral placed to social services to help the patient find a grief counselor. Follow up in 6 weeks or sooner as needed.  - sertraline (ZOLOFT) 100 MG tablet; Take 1 tablet (100 mg total) by mouth daily.  Dispense: 30 tablet; Refill: 3 - ALPRAZolam (XANAX) 0.25 MG tablet; Take 1 tablet (0.25 mg total) by mouth 2 (two) times daily as needed for anxiety.  Dispense: 20 tablet; Refill: 0 - hydrOXYzine (ATARAX) 10 MG tablet; Take 1 tablet (10 mg total) by mouth 3 (three) times daily as needed for anxiety.  Dispense: 30 tablet; Refill: 0 - AMB Referral to [facility] Coordinaton  Follow Up Instructions: 6 weeks csn be virtual     I discussed the assessment and treatment plan with the patient. The patient was provided an opportunity to ask questions and all were answered. The patient agreed with the plan and demonstrated an understanding of the instructions.   The patient was advised to call back or seek an in-person evaluation if the symptoms worsen or if the condition fails to improve as anticipated.  I provided 12 minutes of non-face-to-face time during this encounter.   [name]  [name], DO

## 2021-09-08 NOTE — Chronic Care Management (AMB) (Signed)
°  Care Management   Outreach Note  09/08/2021 Name: Sheri Garrison MRN: [id number] DOB: August 18, 1975  Referred by: Danelle Berry, PA-C Reason for referral : Care Coordination (Initial outreach to schedule referral with Licensed Clinical SW)   A second unsuccessful telephone outreach was attempted today. The patient was referred to the case management team for assistance with care management and care coordination.   Follow Up Plan:  A HIPAA compliant phone message was left for the patient providing contact information and requesting a return call.  If patient returns call to provider office, please advise to call Embedded Care Management Care Guide Jamyah Folk* at [phone]*  Burman Nieves, CCMA Care Guide, Embedded Care Coordination [facility]   Care Management  Direct Dial: [phone]

## 2021-09-12 NOTE — Chronic Care Management (AMB) (Signed)
°  Care Management   Outreach Note  09/12/2021 Name: Sheri Garrison MRN: [id number] DOB: June 30, 1976  Referred by: Danelle Berry, PA-C Reason for referral : Care Coordination (Initial outreach to schedule referral with Licensed Clinical SW)   Third unsuccessful telephone outreach was attempted today. The patient was referred to the case management team for assistance with care management and care coordination. The patient's primary care provider has been notified of our unsuccessful attempts to make or maintain contact with the patient. The care management team is pleased to engage with this patient at any time in the future should he/she be interested in assistance from the care management team.   Follow Up Plan:  We have been unable to make contact with the patient for follow up. The care management team is available to follow up with the patient after provider conversation with the patient regarding recommendation for care management engagement and subsequent re-referral to the care management team.   Burman Nieves, CCMA Care Guide, Embedded Care Coordination [facility]   Care Management  Direct Dial: [phone]

## 2021-10-13 ENCOUNTER — Encounter: Payer: Self-pay | Admitting: Internal Medicine

## 2021-10-13 ENCOUNTER — Other Ambulatory Visit: Payer: Self-pay | Admitting: Internal Medicine

## 2021-10-13 DIAGNOSIS — F4321 Adjustment disorder with depressed mood: Secondary | ICD-10-CM

## 2021-10-13 DIAGNOSIS — F418 Other specified anxiety disorders: Secondary | ICD-10-CM

## 2021-10-14 ENCOUNTER — Encounter: Payer: Self-pay | Admitting: Internal Medicine

## 2021-10-14 ENCOUNTER — Ambulatory Visit (INDEPENDENT_AMBULATORY_CARE_PROVIDER_SITE_OTHER): Payer: No Typology Code available for payment source | Admitting: Internal Medicine

## 2021-10-14 ENCOUNTER — Other Ambulatory Visit: Payer: Self-pay

## 2021-10-14 VITALS — BP 124/80 | HR 91 | Temp 97.7°F | Resp 16 | Ht 62.0 in | Wt 171.1 lb

## 2021-10-14 DIAGNOSIS — F418 Other specified anxiety disorders: Secondary | ICD-10-CM

## 2021-10-14 DIAGNOSIS — F4321 Adjustment disorder with depressed mood: Secondary | ICD-10-CM | POA: Diagnosis not present

## 2021-10-14 MED ORDER — HYDROXYZINE HCL 10 MG PO TABS: 10.0000 mg | ORAL_TABLET | Freq: Every day | ORAL | 0 refills | Status: DC | PRN

## 2021-10-14 MED ORDER — SERTRALINE HCL 50 MG PO TABS: 50.0000 mg | ORAL_TABLET | Freq: Every day | ORAL | 3 refills | Status: DC

## 2021-10-14 NOTE — Progress Notes (Signed)
? ?Established Patient Office Visit ? ?Subjective:  ?Patient ID: Sheri Garrison, female    DOB: [birth year]  Age: 46 y.o. MRN: [id number] ? ?CC:  ?Chief Complaint  ?Patient presents with  ? Anxiety  ? ? ?HPI ?Sheri Garrison presents for anxiety. ? ?Anxiety: ?-Duration:exacerbated lately by death of close friend ?-Anxious mood: yes  ? ?Depression screen [facility] 2/9 10/14/2021 09/05/2021 01/28/2021 09/25/2020 02/09/2020  ?Decreased Interest 3 3 0 0 0  ?Down, Depressed, Hopeless 2 3 0 0 0  ?PHQ - 2 Score 5 6 0 0 0  ?Altered sleeping 3 3 0 0 0  ?Tired, decreased energy 0 3 0 1 0  ?Change in appetite 3 3 0 0 0  ?Feeling bad or failure about yourself  1 3 0 0 0  ?Trouble concentrating 3 3 0 0 0  ?Moving slowly or fidgety/restless 1 0 0 0 0  ?Suicidal thoughts 0 0 0 0 0  ?PHQ-9 Score 16 21 0 1 0  ?Difficult doing work/chores Very difficult Extremely dIfficult Not difficult at all Not difficult at all Not difficult at all  ?Some recent data might be hidden  ? ?-Current Treatments: Zoloft 100, Hydroxyzine PRN ?-Patient is compliant with the above medications at above dose and reports no side effects.  ?-Counseling: yes weekly ? ? ?Past Medical History:  ?Diagnosis Date  ? Anxiety   ? Chronic anxiety 01/03/2016  ? Heart palpitations 04/09/2015  ? Hypothyroidism, adult 04/09/2015  ? Panic disorder without agoraphobia 01/03/2016  ? ? ?Past Surgical History:  ?Procedure Laterality Date  ? CHOLECYSTECTOMY  2004  ? LAPAROSCOPIC GASTRIC SLEEVE RESECTION  2014  ? ? ?Family History  ?Problem Relation Age of Onset  ? Heart disease Mother   ? Hyperlipidemia Mother   ? Hypertension Mother   ? Breast cancer Mother 21  ? Hearing loss Father   ?     due to age  ? Arthritis Maternal Grandmother   ? Hyperlipidemia Maternal Grandmother   ? Hypertension Maternal Grandmother   ? Dementia Maternal Grandmother   ? Heart disease Maternal Grandfather   ? Hyperlipidemia Maternal Grandfather   ? Hypertension Maternal Grandfather   ? Stroke Paternal Grandmother   ? Heart  disease Paternal Grandfather   ? Depression Maternal Aunt   ? Hyperlipidemia Maternal Aunt   ? Hypertension Maternal Aunt   ? Breast cancer Maternal Aunt   ?     64's  ? ? ?Social History  ? ?Socioeconomic History  ? Marital status: Widowed  ?  Spouse name: Not on file  ? Number of children: Not on file  ? Years of education: Not on file  ? Highest education level: Not on file  ?Occupational History  ? Not on file  ?Tobacco Use  ? Smoking status: Never  ? Smokeless tobacco: Never  ?Vaping Use  ? Vaping Use: Never used  ?Substance and Sexual Activity  ? Alcohol use: Yes  ?  Alcohol/week: 0.0 standard drinks  ?  Comment: occasionally  ? Drug use: No  ? Sexual activity: Not Currently  ?  Partners: Male  ?  Birth control/protection: None  ?Other Topics Concern  ? Not on file  ?Social History Narrative  ? Husband passed away suddenly 08/03/2019  ? ?Social Determinants of Health  ? ?Financial Resource Strain: Not on file  ?Food Insecurity: Not on file  ?Transportation Needs: Not on file  ?Physical Activity: Not on file  ?Stress: Not on file  ?Social Connections: Not on  file  ?Intimate Partner Violence: Not on file  ? ? ?Outpatient Medications Prior to Visit  ?Medication Sig Dispense Refill  ? ALPRAZolam (XANAX) 0.25 MG tablet Take 1 tablet (0.25 mg total) by mouth 2 (two) times daily as needed for anxiety. 20 tablet 0  ? ARMOUR THYROID 120 MG tablet Take 120 mg by mouth daily.    ? buPROPion (WELLBUTRIN XL) 300 MG 24 hr tablet TAKE 1 TABLET BY MOUTH  DAILY 90 tablet 1  ? hydrOXYzine (ATARAX) 10 MG tablet Take 1 tablet (10 mg total) by mouth 3 (three) times daily as needed for anxiety. 30 tablet 0  ? levothyroxine (SYNTHROID) 125 MCG tablet TAKE 1 TABLET(125 MCG) BY MOUTH DAILY 90 tablet 2  ? medroxyPROGESTERone (DEPO-PROVERA) 150 MG/ML injection Inject 1 mL (150 mg total) into the muscle every 3 (three) months. 1 mL 4  ? Multiple Vitamin (MULTIVITAMIN) tablet Take 1 tablet by mouth daily.    ? sertraline (ZOLOFT) 100 MG  tablet Take 1 tablet (100 mg total) by mouth daily. 30 tablet 3  ? tiZANidine (ZANAFLEX) 4 MG tablet Take 1 tablet (4 mg total) by mouth every 6 (six) hours as needed for muscle spasms. 30 tablet 0  ? ?No facility-administered medications prior to visit.  ? ? ?Allergies  ?Allergen Reactions  ? Alka-Seltzer Plus Cold [Chlorphen-Phenyleph-Asa] Swelling  ? Tessalon [Benzonatate] Hives and Swelling  ? ? ?ROS ?Review of Systems  ?Constitutional:  Negative for unexpected weight change.  ?Psychiatric/Behavioral:  The patient is nervous/anxious.   ? ?  ?Objective:  ?  ?Physical Exam ?Constitutional:   ?   Appearance: Normal appearance.  ?HENT:  ?   Head: Normocephalic and atraumatic.  ?Eyes:  ?   Conjunctiva/sclera: Conjunctivae normal.  ?Cardiovascular:  ?   Rate and Rhythm: Normal rate and regular rhythm.  ?Pulmonary:  ?   Effort: Pulmonary effort is normal.  ?   Breath sounds: Normal breath sounds.  ?Skin: ?   General: Skin is warm and dry.  ?Neurological:  ?   General: No focal deficit present.  ?   Mental Status: She is alert. Mental status is at baseline.  ?Psychiatric:     ?   Mood and Affect: Mood normal.     ?   Behavior: Behavior normal.  ? ? ?BP 124/80   Pulse 91   Temp 97.7 ?F (36.5 ?C)   Resp 16   Ht 5\' 2"  (1.575 m)   Wt 171 lb 1.6 oz (77.6 kg)   SpO2 99%   BMI 31.29 kg/m?  ?Wt Readings from Last 3 Encounters:  ?10/14/21 171 lb 1.6 oz (77.6 kg)  ?01/28/21 174 lb 12.8 oz (79.3 kg)  ?09/25/20 162 lb (73.5 kg)  ? ? ? ?Health Maintenance Due  ?Topic Date Due  ? COVID-19 Vaccine (1) Never Garrison  ? COLONOSCOPY (Pts 45-32yrs Insurance coverage will need to be confirmed)  Never Garrison  ? MAMMOGRAM  03/22/2019  ? INFLUENZA VACCINE  03/03/2021  ? ? ?There are no preventive care reminders to display for this patient. ? ?Lab Results  ?Component Value Date  ? TSH 0.29 (L) 09/25/2020  ? ?Lab Results  ?Component Value Date  ? WBC 9.8 09/25/2020  ? HGB 13.8 09/25/2020  ? HCT 40.3 09/25/2020  ? MCV 90.0 09/25/2020  ? PLT  442 (H) 09/25/2020  ? ?Lab Results  ?Component Value Date  ? NA 138 02/09/2020  ? K 4.3 02/09/2020  ? CO2 27 02/09/2020  ? GLUCOSE 79  02/09/2020  ? BUN 16 02/09/2020  ? CREATININE 0.88 02/09/2020  ? BILITOT 0.6 02/09/2020  ? ALKPHOS 63 03/23/2017  ? AST 19 02/09/2020  ? ALT 17 02/09/2020  ? PROT 7.7 02/09/2020  ? ALBUMIN 4.3 03/23/2017  ? CALCIUM 9.9 02/09/2020  ? ?Lab Results  ?Component Value Date  ? CHOL 156 09/25/2020  ? ?Lab Results  ?Component Value Date  ? HDL 54 09/25/2020  ? ?Lab Results  ?Component Value Date  ? Gratis 77 09/25/2020  ? ?Lab Results  ?Component Value Date  ? TRIG 156 (H) 09/25/2020  ? ?Lab Results  ?Component Value Date  ? CHOLHDL 2.9 09/25/2020  ? ?No results found for: HGBA1C ? ?  ?Assessment & Plan:  ? ?Problem List Items Addressed This Visit   ? ?  ? Other  ? Depression with anxiety - Primary  ?  Increase Zoloft to 150 mg today, continue Hydroxyzine as needed. Also on concurrent therapy with Wellbutrin, participating in therapy as well.  ?  ?  ? Relevant Medications  ? sertraline (ZOLOFT) 50 MG tablet  ? hydrOXYzine (ATARAX) 10 MG tablet  ? ?Other Visit Diagnoses   ? ? Grief    - participating in therapy, doing better but discussed things will take time.   ? ?  ? ? ?Meds ordered this encounter  ?Medications  ? sertraline (ZOLOFT) 50 MG tablet  ?  Sig: Take 1 tablet (50 mg total) by mouth daily. Take with 100 mg for a total of 150 mg daily.  ?  Dispense:  90 tablet  ?  Refill:  3  ? hydrOXYzine (ATARAX) 10 MG tablet  ?  Sig: Take 1 tablet (10 mg total) by mouth daily as needed for anxiety.  ?  Dispense:  90 tablet  ?  Refill:  0  ? ? ?Follow-up: Return in about 6 weeks (around 11/25/2021) for virtual .  ? ? ?Teodora Medici, DO ?

## 2021-10-14 NOTE — Patient Instructions (Addendum)
It was great seeing you today! ? ?Plan discussed at today's visit: ?-Zoloft increased to 150 mg daily ?-Continue to use Hydroxyzine as needed  ? ?Follow up in: 6 weeks  ? ?Take care and let us know if you have any questions or concerns prior to your next visit. ? ?Dr. [name] ? ?

## 2021-10-14 NOTE — Telephone Encounter (Signed)
Requested medication (s) are due for refill today: yes ? ?Requested medication (s) are on the active medication list: yes   ? ?Last refill: 12/03/21  #30  0 refills ? ?Future visit scheduled yes 10/14/21 ? ?Notes to clinic:Failed due to labs. Pt has appt today, please review. Thank you ? ?Requested Prescriptions  ?Pending Prescriptions Disp Refills  ? hydrOXYzine (ATARAX) 10 MG tablet [Pharmacy Med Name: HYDROXYZINE HCL 10MG  TABLETS] 30 tablet 0  ?  Sig: TAKE 1 TABLET(10 MG) BY MOUTH THREE TIMES DAILY AS NEEDED FOR ANXIETY  ?  ? Ear, Nose, and Throat:  Antihistamines 2 Failed - 10/13/2021 10:50 AM  ?  ?  Failed - Cr in normal range and within 360 days  ?  Creat  ?Date Value Ref Range Status  ?02/09/2020 0.88 0.50 - 1.10 mg/dL Final  ?  ?  ?  ?  Passed - Valid encounter within last 12 months  ?  Recent Outpatient Visits   ? ?      ? 1 month ago Grief  ? [facility], DO  ? 8 months ago Muscle strain  ? [facility] M, DO  ? 1 year ago Hypothyroidism, adult  ? [facility], DO  ? 1 year ago Adult general medical exam  ? [facility], PA-C  ? 2 years ago Grief reaction  ? [facility], Doren Custard  ? ?  ?  ?Future Appointments   ? ?        ? Today [place], DO [facility]  ? ?  ? ?  ?  ?  ? ? ? ? ?

## 2021-10-14 NOTE — Telephone Encounter (Signed)
Not a [facility] patient.  

## 2021-10-14 NOTE — Assessment & Plan Note (Signed)
Increase Zoloft to 150 mg today, continue Hydroxyzine as needed. Also on concurrent therapy with Wellbutrin, participating in therapy as well.  ?

## 2021-10-15 NOTE — Telephone Encounter (Signed)
Sent in error to wrong practice. Apologies. Looked like filled at appt. ?

## 2021-11-27 ENCOUNTER — Ambulatory Visit: Payer: No Typology Code available for payment source | Admitting: Internal Medicine

## 2021-11-27 NOTE — Progress Notes (Deleted)
Established Patient Office Visit  Subjective:  Patient ID: Sheri Garrison, female    DOB: 11-03-1975  Age: 46 y.o. MRN: [id number]  CC:  No chief complaint on file.   HPI Sheri Garrison presents for follow up on anxiety.  Anxiety: -Duration:exacerbated lately by death of close friend -Anxious mood: yes      10/14/2021   10:34 AM 09/05/2021    8:01 AM 01/28/2021   10:36 AM 09/25/2020    2:34 PM 02/09/2020    3:05 PM  Depression screen PHQ 2/9  Decreased Interest 3 3 0 0 0  Down, Depressed, Hopeless 2 3 0 0 0  PHQ - 2 Score 5 6 0 0 0  Altered sleeping 3 3 0 0 0  Tired, decreased energy 0 3 0 1 0  Change in appetite 3 3 0 0 0  Feeling bad or failure about yourself  1 3 0 0 0  Trouble concentrating 3 3 0 0 0  Moving slowly or fidgety/restless 1 0 0 0 0  Suicidal thoughts 0 0 0 0 0  PHQ-9 Score 16 21 0 1 0  Difficult doing work/chores Very difficult Extremely dIfficult Not difficult at all Not difficult at all Not difficult at all   -Current Treatments: Zoloft 150 mg, Hydroxyzine PRN -Patient is compliant with the above medications at above dose and reports no side effects.  -Counseling: yes weekly  Hypothyroidism: -Medications: Levothyroxine 125 mcg  -Patient is compliant with the above medication (s) at the above dose and reports no medication side effects. *** -Denies weight changes, cold./heat intolerance, skin changes, anxiety/palpitations *** -Last TSH: 2/22 0.29  Health Maintenance: -Blood work due -Mammogram due; last 03/2018 -Colon cancer screening due   Past Medical History:  Diagnosis Date   Anxiety    Chronic anxiety 01/03/2016   Heart palpitations 04/09/2015   Hypothyroidism, adult 04/09/2015   Panic disorder without agoraphobia 01/03/2016    Past Surgical History:  Procedure Laterality Date   CHOLECYSTECTOMY  2004   LAPAROSCOPIC GASTRIC SLEEVE RESECTION  2014    Family History  Problem Relation Age of Onset   Heart disease Mother    Hyperlipidemia  Mother    Hypertension Mother    Breast cancer Mother 23   Hearing loss Father        due to age   Arthritis Maternal Grandmother    Hyperlipidemia Maternal Grandmother    Hypertension Maternal Grandmother    Dementia Maternal Grandmother    Heart disease Maternal Grandfather    Hyperlipidemia Maternal Grandfather    Hypertension Maternal Grandfather    Stroke Paternal Grandmother    Heart disease Paternal Grandfather    Depression Maternal Aunt    Hyperlipidemia Maternal Aunt    Hypertension Maternal Aunt    Breast cancer Maternal Aunt        16's    Social History   Socioeconomic History   Marital status: Widowed    Spouse name: Not on file   Number of children: Not on file   Years of education: Not on file   Highest education level: Not on file  Occupational History   Not on file  Tobacco Use   Smoking status: Never   Smokeless tobacco: Never  Vaping Use   Vaping Use: Never used  Substance and Sexual Activity   Alcohol use: Yes    Alcohol/week: 0.0 standard drinks    Comment: occasionally   Drug use: No   Sexual activity: Not Currently  Partners: Male    Birth control/protection: None  Other Topics Concern   Not on file  Social History Narrative   Husband passed away suddenly 2019/07/20   Social Determinants of Health   Financial Resource Strain: Not on file  Food Insecurity: Not on file  Transportation Needs: Not on file  Physical Activity: Not on file  Stress: Not on file  Social Connections: Not on file  Intimate Partner Violence: Not on file    Outpatient Medications Prior to Visit  Medication Sig Dispense Refill   ALPRAZolam (XANAX) 0.25 MG tablet Take 1 tablet (0.25 mg total) by mouth 2 (two) times daily as needed for anxiety. 20 tablet 0   ARMOUR THYROID 120 MG tablet Take 120 mg by mouth daily.     buPROPion (WELLBUTRIN XL) 300 MG 24 hr tablet TAKE 1 TABLET BY MOUTH  DAILY 90 tablet 1   hydrOXYzine (ATARAX) 10 MG tablet Take 1 tablet (10 mg  total) by mouth daily as needed for anxiety. 90 tablet 0   levothyroxine (SYNTHROID) 125 MCG tablet TAKE 1 TABLET(125 MCG) BY MOUTH DAILY 90 tablet 2   medroxyPROGESTERone (DEPO-PROVERA) 150 MG/ML injection Inject 1 mL (150 mg total) into the muscle every 3 (three) months. 1 mL 4   Multiple Vitamin (MULTIVITAMIN) tablet Take 1 tablet by mouth daily.     sertraline (ZOLOFT) 100 MG tablet Take 1 tablet (100 mg total) by mouth daily. 30 tablet 3   sertraline (ZOLOFT) 50 MG tablet Take 1 tablet (50 mg total) by mouth daily. Take with 100 mg for a total of 150 mg daily. 90 tablet 3   tiZANidine (ZANAFLEX) 4 MG tablet Take 1 tablet (4 mg total) by mouth every 6 (six) hours as needed for muscle spasms. 30 tablet 0   No facility-administered medications prior to visit.    Allergies  Allergen Reactions   Alka-Seltzer Plus Cold [Chlorphen-Phenyleph-Asa] Swelling   Tessalon [Benzonatate] Hives and Swelling    ROS Review of Systems  Constitutional:  Negative for unexpected weight change.  Psychiatric/Behavioral:  The patient is nervous/anxious.      Objective:    Physical Exam Constitutional:      Appearance: Normal appearance.  HENT:     Head: Normocephalic and atraumatic.  Eyes:     Conjunctiva/sclera: Conjunctivae normal.  Cardiovascular:     Rate and Rhythm: Normal rate and regular rhythm.  Pulmonary:     Effort: Pulmonary effort is normal.     Breath sounds: Normal breath sounds.  Skin:    General: Skin is warm and dry.  Neurological:     General: No focal deficit present.     Mental Status: She is alert. Mental status is at baseline.  Psychiatric:        Mood and Affect: Mood normal.        Behavior: Behavior normal.    There were no vitals taken for this visit. Wt Readings from Last 3 Encounters:  10/14/21 171 lb 1.6 oz (77.6 kg)  01/28/21 174 lb 12.8 oz (79.3 kg)  09/25/20 162 lb (73.5 kg)     Health Maintenance Due  Topic Date Due   COVID-19 Vaccine (1) Never  done   COLONOSCOPY (Pts 45-19yrs Insurance coverage will need to be confirmed)  Never done   MAMMOGRAM  03/22/2019    There are no preventive care reminders to display for this patient.  Lab Results  Component Value Date   TSH 0.29 (L) 09/25/2020   Lab Results  Component Value Date   WBC 9.8 09/25/2020   HGB 13.8 09/25/2020   HCT 40.3 09/25/2020   MCV 90.0 09/25/2020   PLT 442 (H) 09/25/2020   Lab Results  Component Value Date   NA 138 02/09/2020   K 4.3 02/09/2020   CO2 27 02/09/2020   GLUCOSE 79 02/09/2020   BUN 16 02/09/2020   CREATININE 0.88 02/09/2020   BILITOT 0.6 02/09/2020   ALKPHOS 63 03/23/2017   AST 19 02/09/2020   ALT 17 02/09/2020   PROT 7.7 02/09/2020   ALBUMIN 4.3 03/23/2017   CALCIUM 9.9 02/09/2020   Lab Results  Component Value Date   CHOL 156 09/25/2020   Lab Results  Component Value Date   HDL 54 09/25/2020   Lab Results  Component Value Date   LDLCALC 77 09/25/2020   Lab Results  Component Value Date   TRIG 156 (H) 09/25/2020   Lab Results  Component Value Date   CHOLHDL 2.9 09/25/2020   No results found for: HGBA1C    Assessment & Plan:   Problem List Items Addressed This Visit       Other   Depression with anxiety - Primary    Increase Zoloft to 150 mg today, continue Hydroxyzine as needed. Also on concurrent therapy with Wellbutrin, participating in therapy as well.       Relevant Medications   sertraline (ZOLOFT) 50 MG tablet   hydrOXYzine (ATARAX) 10 MG tablet   Other Visit Diagnoses     Grief    - participating in therapy, doing better but discussed things will take time.        No orders of the defined types were placed in this encounter.   Follow-up: No follow-ups on file.    Margarita MailElisabeth Glenwood Revoir, DO

## 2021-11-30 ENCOUNTER — Other Ambulatory Visit: Payer: Self-pay | Admitting: Family Medicine

## 2021-11-30 DIAGNOSIS — F418 Other specified anxiety disorders: Secondary | ICD-10-CM

## 2022-01-06 ENCOUNTER — Other Ambulatory Visit: Payer: Self-pay | Admitting: Family Medicine

## 2022-01-06 DIAGNOSIS — F418 Other specified anxiety disorders: Secondary | ICD-10-CM

## 2022-01-08 ENCOUNTER — Other Ambulatory Visit: Payer: Self-pay | Admitting: Family Medicine

## 2022-01-08 DIAGNOSIS — F418 Other specified anxiety disorders: Secondary | ICD-10-CM

## 2022-01-15 ENCOUNTER — Other Ambulatory Visit: Payer: Self-pay | Admitting: Internal Medicine

## 2022-01-15 DIAGNOSIS — F418 Other specified anxiety disorders: Secondary | ICD-10-CM

## 2022-01-15 DIAGNOSIS — F4321 Adjustment disorder with depressed mood: Secondary | ICD-10-CM

## 2022-01-15 NOTE — Telephone Encounter (Signed)
Requested medication (s) are due for refill today: yes  Requested medication (s) are on the active medication list: yes  Last refill:  hydroxyzine 10/14/21 #90/0, sertraline 01/07/22 #180/0   Future visit scheduled: no  Notes to clinic:  Unable to refill per protocol due to failed labs, no updated results. Pharmacy requesting 1 year supply.      Requested Prescriptions  Pending Prescriptions Disp Refills   hydrOXYzine (ATARAX) 10 MG tablet [Pharmacy Med Name: HYDROXYZINE HCL 10MG  TABLETS] 90 tablet 0    Sig: TAKE 1 TABLET(10 MG) BY MOUTH DAILY AS NEEDED FOR ANXIETY     Ear, Nose, and Throat:  Antihistamines 2 Failed - 01/15/2022 11:07 AM      Failed - Cr in normal range and within 360 days    Creat  Date Value Ref Range Status  02/09/2020 0.88 0.50 - 1.10 mg/dL Final         Passed - Valid encounter within last 12 months    Recent Outpatient Visits           3 months ago Depression with anxiety   [facility], DO   4 months ago Grief   [facility] [place], [place], DO   11 months ago Muscle strain   [facility] [place], [place], DO   1 year ago Hypothyroidism, adult   [facility], DO   1 year ago Adult general medical exam   [facility], PA-C               sertraline (ZOLOFT) 100 MG tablet [Pharmacy Med Name: SERTRALINE 100MG  TABLETS] 30 tablet 3    Sig: TAKE 1 TABLET(100 MG) BY MOUTH DAILY     Psychiatry:  Antidepressants - SSRI - sertraline Failed - 01/15/2022 11:07 AM      Failed - AST in normal range and within 360 days    AST  Date Value Ref Range Status  02/09/2020 19 10 - 35 U/L Final         Failed - ALT in normal range and within 360 days    ALT  Date Value Ref Range Status  02/09/2020 17 6 - 29 U/L Final         Passed - Completed PHQ-2 or PHQ-9 in the last 360 days      Passed - Valid encounter  within last 6 months    Recent Outpatient Visits           3 months ago Depression with anxiety   [facility], DO   4 months ago Grief   [facility] Margarita Mail, DO   11 months ago Muscle strain   [facility] Margarita Mail, DO   1 year ago Hypothyroidism, adult   [facility] Caro Laroche, DO   1 year ago Adult general medical exam   [facility], [facility]

## 2022-03-23 ENCOUNTER — Telehealth: Payer: Self-pay | Admitting: Internal Medicine

## 2022-03-23 DIAGNOSIS — F418 Other specified anxiety disorders: Secondary | ICD-10-CM

## 2022-03-24 NOTE — Telephone Encounter (Signed)
last RF 01/07/22 #180 no refills- should have enough until 04/09/22   Requested Prescriptions  Refused Prescriptions Disp Refills  . sertraline (ZOLOFT) 50 MG tablet [Pharmacy Med Name: Sertraline HCl 50 MG Oral Tablet] 180 tablet 3    Sig: TAKE 2 TABLETS BY MOUTH DAILY     Psychiatry:  Antidepressants - SSRI - sertraline Failed - 03/23/2022 10:48 PM      Failed - AST in normal range and within 360 days    AST  Date Value Ref Range Status  02/09/2020 19 10 - 35 U/L Final         Failed - ALT in normal range and within 360 days    ALT  Date Value Ref Range Status  02/09/2020 17 6 - 29 U/L Final         Passed - Completed PHQ-2 or PHQ-9 in the last 360 days      Passed - Valid encounter within last 6 months    Recent Outpatient Visits          5 months ago Depression with anxiety   [facility] Margarita Mail, DO   6 months ago Grief   [facility] Margarita Mail, DO   1 year ago Muscle strain   [facility] Caro Laroche, DO   1 year ago Hypothyroidism, adult   [facility] Caro Laroche, DO   2 years ago Adult general medical exam   [facility] Danelle Berry, [place]

## 2022-03-25 ENCOUNTER — Other Ambulatory Visit: Payer: Self-pay

## 2022-04-26 ENCOUNTER — Other Ambulatory Visit: Payer: Self-pay | Admitting: Internal Medicine

## 2022-04-26 DIAGNOSIS — F418 Other specified anxiety disorders: Secondary | ICD-10-CM

## 2022-04-27 NOTE — Telephone Encounter (Signed)
Requested Prescriptions  Pending Prescriptions Disp Refills  . hydrOXYzine (ATARAX) 10 MG tablet [Pharmacy Med Name: HYDROXYZINE HCL 10MG  TABLETS] 90 tablet 0    Sig: TAKE 1 TABLET(10 MG) BY MOUTH DAILY AS NEEDED FOR ANXIETY     Ear, Nose, and Throat:  Antihistamines 2 Failed - 04/26/2022 10:57 AM      Failed - Cr in normal range and within 360 days    Creat  Date Value Ref Range Status  02/09/2020 0.88 0.50 - 1.10 mg/dL Final         Passed - Valid encounter within last 12 months    Recent Outpatient Visits          6 months ago Depression with anxiety   [place], DO   7 months ago [facility] Teodora Medici, DO   1 year ago Muscle strain   [facility] Myles Gip, DO   1 year ago Hypothyroidism, adult   [facility] Myles Gip, DO   2 years ago Adult general medical exam   [facility] Delsa Grana, [place]

## 2022-05-12 ENCOUNTER — Other Ambulatory Visit: Payer: Self-pay | Admitting: Internal Medicine

## 2022-05-12 DIAGNOSIS — F418 Other specified anxiety disorders: Secondary | ICD-10-CM

## 2022-05-13 NOTE — Telephone Encounter (Signed)
Requested Prescriptions  Pending Prescriptions Disp Refills  . buPROPion (WELLBUTRIN XL) 300 MG 24 hr tablet [Pharmacy Med Name: buPROPion HCl ER (XL) 300 MG Oral Tablet Extended Release 24 Hour] 90 tablet 0    Sig: TAKE 1 TABLET BY MOUTH DAILY     Psychiatry: Antidepressants - bupropion Failed - 05/12/2022 11:29 PM      Failed - Cr in normal range and within 360 days    Creat  Date Value Ref Range Status  02/09/2020 0.88 0.50 - 1.10 mg/dL Final         Failed - AST in normal range and within 360 days    AST  Date Value Ref Range Status  02/09/2020 19 10 - 35 U/L Final         Failed - ALT in normal range and within 360 days    ALT  Date Value Ref Range Status  02/09/2020 17 6 - 29 U/L Final         Failed - Valid encounter within last 6 months    Recent Outpatient Visits          7 months ago Depression with anxiety   [place], DO   8 months ago [place], DO   1 year ago Muscle strain   [facility] Myles Gip, DO   1 year ago Hypothyroidism, adult   [facility] Myles Gip, DO   2 years ago Adult general medical exam   [facility] Delsa Grana, PA-C             Passed - Completed PHQ-2 or PHQ-9 in the last 360 days      Passed - Last BP in normal range    BP Readings from Last 1 Encounters:  10/14/21 124/80

## 2022-07-22 ENCOUNTER — Other Ambulatory Visit: Payer: Self-pay | Admitting: Internal Medicine

## 2022-07-22 DIAGNOSIS — F418 Other specified anxiety disorders: Secondary | ICD-10-CM

## 2022-07-22 NOTE — Telephone Encounter (Signed)
Called patient to schedule appt for medication refills. No answer, LVMTCB [phone].

## 2022-07-22 NOTE — Telephone Encounter (Signed)
Courtesy refill. Needs appt. Called patient to schedule appt no answer, LVMTCB.  Requested Prescriptions  Pending Prescriptions Disp Refills   buPROPion (WELLBUTRIN XL) 300 MG 24 hr tablet [Pharmacy Med Name: buPROPion HCl ER (XL) 300 MG Oral Tablet Extended Release 24 Hour] 30 tablet 0    Sig: TAKE 1 TABLET BY MOUTH DAILY     Psychiatry: Antidepressants - bupropion Failed - 07/22/2022  7:41 AM      Failed - Cr in normal range and within 360 days    Creat  Date Value Ref Range Status  02/09/2020 0.88 0.50 - 1.10 mg/dL Final         Failed - AST in normal range and within 360 days    AST  Date Value Ref Range Status  02/09/2020 19 10 - 35 U/L Final         Failed - ALT in normal range and within 360 days    ALT  Date Value Ref Range Status  02/09/2020 17 6 - 29 U/L Final         Failed - Valid encounter within last 6 months    Recent Outpatient Visits           9 months ago Depression with anxiety   [facility] Margarita Mail, DO   10 months ago Grief   [facility] Margarita Mail, DO   1 year ago Muscle strain   [facility] Caro Lar[facility], DO   1 year ago Hypothyroidism, adult   [facility] Caro Laroche, DO   2 years ago Adult general medical exam   [facility] Danelle Berry, PA-C              Passed - Completed PHQ-2 or PHQ-9 in the last 360 days      Passed - Last BP in normal range    BP Readings from Last 1 Encounters:  10/14/21 124/80

## 2022-07-28 ENCOUNTER — Other Ambulatory Visit: Payer: Self-pay | Admitting: Internal Medicine

## 2022-07-28 DIAGNOSIS — F418 Other specified anxiety disorders: Secondary | ICD-10-CM

## 2022-07-30 NOTE — Telephone Encounter (Signed)
Requested medication (s) are due for refill today: yes  Requested medication (s) are on the active medication list: yes  Last refill:  04/27/22 #90  Future visit scheduled: no  Notes to clinic:  overdue lab work   Requested Prescriptions  Pending Prescriptions Disp Refills   hydrOXYzine (ATARAX) 10 MG tablet [Pharmacy Med Name: HYDROXYZINE HCL 10MG  TABLETS] 90 tablet 0    Sig: TAKE 1 TABLET(10 MG) BY MOUTH DAILY AS NEEDED FOR ANXIETY     Ear, Nose, and Throat:  Antihistamines 2 Failed - 07/28/2022 10:15 AM      Failed - Cr in normal range and within 360 days    Creat  Date Value Ref Range Status  02/09/2020 0.88 0.50 - 1.10 mg/dL Final         Passed - Valid encounter within last 12 months    Recent Outpatient Visits           9 months ago Depression with anxiety   [facility], DO   10 months ago Grief   [facility], DO   1 year ago Muscle strain   [facility], DO   1 year ago Hypothyroidism, adult   [facility], DO   2 years ago Adult general medical exam   [facility], Danelle Berry

## 2022-08-17 ENCOUNTER — Other Ambulatory Visit: Payer: Self-pay | Admitting: Internal Medicine

## 2022-08-17 DIAGNOSIS — F418 Other specified anxiety disorders: Secondary | ICD-10-CM

## 2022-08-18 NOTE — Telephone Encounter (Signed)
Requested medications are due for refill today.  yes  Requested medications are on the active medications list.  yes  Last refill. 07/22/2022 #30 0 rf  Future visit scheduled.   no  Notes to clinic.  PT is more than 3 months overdue for OV. Pt already given a courtesy refill.    Requested Prescriptions  Pending Prescriptions Disp Refills   buPROPion (WELLBUTRIN XL) 300 MG 24 hr tablet [Pharmacy Med Name: buPROPion HCl ER (XL) 300 MG Oral Tablet Extended Release 24 Hour] 30 tablet 11    Sig: TAKE 1 TABLET BY MOUTH DAILY     Psychiatry: Antidepressants - bupropion Failed - 08/17/2022 10:31 PM      Failed - Cr in normal range and within 360 days    Creat  Date Value Ref Range Status  02/09/2020 0.88 0.50 - 1.10 mg/dL Final         Failed - AST in normal range and within 360 days    AST  Date Value Ref Range Status  02/09/2020 19 10 - 35 U/L Final         Failed - ALT in normal range and within 360 days    ALT  Date Value Ref Range Status  02/09/2020 17 6 - 29 U/L Final         Failed - Valid encounter within last 6 months    Recent Outpatient Visits           10 months ago Depression with anxiety   [place], DO   11 months ago [place], DO   1 year ago Muscle strain   [facility] Myles Gip, DO   1 year ago Hypothyroidism, adult   [facility] Myles Gip, DO   2 years ago Adult general medical exam   [facility] Delsa Grana, PA-C              Passed - Completed PHQ-2 or PHQ-9 in the last 360 days      Passed - Last BP in normal range    BP Readings from Last 1 Encounters:  10/14/21 124/80

## 2022-09-02 ENCOUNTER — Encounter: Payer: Self-pay | Admitting: Internal Medicine

## 2022-09-02 ENCOUNTER — Other Ambulatory Visit: Payer: Self-pay | Admitting: Internal Medicine

## 2022-09-02 DIAGNOSIS — R112 Nausea with vomiting, unspecified: Secondary | ICD-10-CM

## 2022-09-02 MED ORDER — ONDANSETRON HCL 4 MG PO TABS: 4.0000 mg | ORAL_TABLET | Freq: Three times a day (TID) | ORAL | 0 refills | Status: DC | PRN

## 2022-10-23 ENCOUNTER — Other Ambulatory Visit: Payer: Self-pay | Admitting: Internal Medicine

## 2022-10-23 DIAGNOSIS — Z1231 Encounter for screening mammogram for malignant neoplasm of breast: Secondary | ICD-10-CM

## 2022-10-27 ENCOUNTER — Ambulatory Visit
Admission: RE | Admit: 2022-10-27 | Discharge: 2022-10-27 | Disposition: A | Discharge status: Home / Self Care | Payer: No Typology Code available for payment source | Source: Ambulatory Visit | Attending: Internal Medicine | Admitting: Internal Medicine

## 2022-10-27 DIAGNOSIS — Z1231 Encounter for screening mammogram for malignant neoplasm of breast: Secondary | ICD-10-CM | POA: Diagnosis not present

## 2022-11-01 ENCOUNTER — Other Ambulatory Visit: Payer: Self-pay | Admitting: Internal Medicine

## 2022-11-01 DIAGNOSIS — F418 Other specified anxiety disorders: Secondary | ICD-10-CM

## 2022-11-02 NOTE — Telephone Encounter (Signed)
Unable to refill per protocol, Rx request is too soon. Last refill 08/19/22 for 30 and 3 refills. Patient needs OV for additional refills.  Requested Prescriptions  Pending Prescriptions Disp Refills   buPROPion (WELLBUTRIN XL) 300 MG 24 hr tablet [Pharmacy Med Name: buPROPion HCl ER (XL) 300 MG Oral Tablet Extended Release 24 Hour] 30 tablet 11    Sig: TAKE 1 TABLET BY MOUTH DAILY     Psychiatry: Antidepressants - bupropion Failed - 11/01/2022 10:54 PM      Failed - Cr in normal range and within 360 days    Creat  Date Value Ref Range Status  02/09/2020 0.88 0.50 - 1.10 mg/dL Final         Failed - AST in normal range and within 360 days    AST  Date Value Ref Range Status  02/09/2020 19 10 - 35 U/L Final         Failed - ALT in normal range and within 360 days    ALT  Date Value Ref Range Status  02/09/2020 17 6 - 29 U/L Final         Failed - Completed PHQ-2 or PHQ-9 in the last 360 days      Failed - Valid encounter within last 6 months    Recent Outpatient Visits           1 year ago Depression with anxiety   [place], DO   1 year ago [facility] Teodora Medici, DO   1 year ago Muscle strain   [facility] Myles Gip, DO   2 years ago Hypothyroidism, adult   [facility] Myles Gip, DO   2 years ago Adult general medical exam   [facility] Delsa Grana, PA-C              Passed - Last BP in normal range    BP Readings from Last 1 Encounters:  10/14/21 124/80

## 2022-11-02 NOTE — Telephone Encounter (Signed)
Lvm for patient to call and schedule an appt before dr can refill medication.

## 2022-11-05 ENCOUNTER — Other Ambulatory Visit: Payer: Self-pay | Admitting: Internal Medicine

## 2022-11-05 DIAGNOSIS — F418 Other specified anxiety disorders: Secondary | ICD-10-CM

## 2022-11-06 NOTE — Telephone Encounter (Signed)
Requested medications are due for refill today.  yes  Requested medications are on the active medications list.  yes  Last refill. 07/30/2022 #90 0 rf  Future visit scheduled.   no  Notes to clinic.  Labs are expired.    Requested Prescriptions  Pending Prescriptions Disp Refills   hydrOXYzine (ATARAX) 10 MG tablet [Pharmacy Med Name: HYDROXYZINE HCL 10MG  TABLETS] 90 tablet 0    Sig: TAKE 1 TABLET(10 MG) BY MOUTH DAILY AS NEEDED FOR ANXIETY     Ear, Nose, and Throat:  Antihistamines 2 Failed - 11/05/2022  6:28 PM      Failed - Cr in normal range and within 360 days    Creat  Date Value Ref Range Status  02/09/2020 0.88 0.50 - 1.10 mg/dL Final         Failed - Valid encounter within last 12 months    Recent Outpatient Visits           1 year ago Depression with anxiety   [facility] Margarita Mail, DO   1 year ago Grief   [facility] Margarita Mail, DO   1 year ago Muscle strain   [facility] Caro Laroche, DO   2 years ago Hypothyroidism, adult   [facility] Caro Laroche, DO   2 years ago Adult general medical exam   [facility] Danelle Berry, [place]

## 2022-11-29 ENCOUNTER — Other Ambulatory Visit: Payer: Self-pay | Admitting: Internal Medicine

## 2022-11-29 DIAGNOSIS — F418 Other specified anxiety disorders: Secondary | ICD-10-CM

## 2022-12-01 NOTE — Telephone Encounter (Signed)
Patient needs OV, will refill for 30 days until OV can be made. OV needed for additional refills.  Requested Prescriptions  Pending Prescriptions Disp Refills   buPROPion (WELLBUTRIN XL) 300 MG 24 hr tablet [Pharmacy Med Name: buPROPion HCl ER (XL) 300 MG Oral Tablet Extended Release 24 Hour] 30 tablet 0    Sig: TAKE 1 TABLET BY MOUTH DAILY     Psychiatry: Antidepressants - bupropion Failed - 11/29/2022 10:23 PM      Failed - Cr in normal range and within 360 days    Creat  Date Value Ref Range Status  02/09/2020 0.88 0.50 - 1.10 mg/dL Final         Failed - AST in normal range and within 360 days    AST  Date Value Ref Range Status  02/09/2020 19 10 - 35 U/L Final         Failed - ALT in normal range and within 360 days    ALT  Date Value Ref Range Status  02/09/2020 17 6 - 29 U/L Final         Failed - Completed PHQ-2 or PHQ-9 in the last 360 days      Failed - Valid encounter within last 6 months    Recent Outpatient Visits           1 year ago Depression with anxiety   [facility] Margarita Mail, DO   1 year ago Grief   [facility] Margarita Mail, DO   1 year ago Muscle strain   [facility] Caro Laroche, DO   2 years ago Hypothyroidism, adult   [facility] Caro Laroche, DO   2 years ago Adult general medical exam   [facility] Danelle Berry, PA-C              Passed - Last BP in normal range    BP Readings from Last 1 Encounters:  10/14/21 124/80

## 2022-12-22 ENCOUNTER — Other Ambulatory Visit: Payer: Self-pay | Admitting: Internal Medicine

## 2022-12-22 DIAGNOSIS — F418 Other specified anxiety disorders: Secondary | ICD-10-CM

## 2022-12-23 NOTE — Telephone Encounter (Signed)
Requested medication (s) are due for refill today: Due 12/31/22  Requested medication (s) are on the active medication list: yes    Last refill:   12/01/22  #30  0 refills  Future visit scheduled   no  Notes to clinic:  Failed due to labs, please review. Thank you.  Requested Prescriptions  Pending Prescriptions Disp Refills   buPROPion (WELLBUTRIN XL) 300 MG 24 hr tablet [Pharmacy Med Name: buPROPion HCl ER (XL) 300 MG Oral Tablet Extended Release 24 Hour] 30 tablet 11    Sig: TAKE 1 TABLET BY MOUTH DAILY     Psychiatry: Antidepressants - bupropion Failed - 12/22/2022 10:25 PM      Failed - Cr in normal range and within 360 days    Creat  Date Value Ref Range Status  02/09/2020 0.88 0.50 - 1.10 mg/dL Final         Failed - AST in normal range and within 360 days    AST  Date Value Ref Range Status  02/09/2020 19 10 - 35 U/L Final         Failed - ALT in normal range and within 360 days    ALT  Date Value Ref Range Status  02/09/2020 17 6 - 29 U/L Final         Failed - Completed PHQ-2 or PHQ-9 in the last 360 days      Failed - Valid encounter within last 6 months    Recent Outpatient Visits           1 year ago Depression with anxiety   [facility] Margarita Mail, DO   1 year ago Grief   [facility] Margarita Mail, DO   1 year ago Muscle strain   [facility] Caro Laroche, DO   2 years ago Hypothyroidism, adult   [facility] Caro Laroche, DO   2 years ago Adult general medical exam   [facility] Danelle Berry, PA-C              Passed - Last BP in normal range    BP Readings from Last 1 Encounters:  10/14/21 124/80

## 2023-03-04 NOTE — Progress Notes (Signed)
Acute Office Visit  Subjective:     Patient ID: Sheri Garrison, female    DOB: 30-Jun-1976, 47 y.o.   MRN: [id number]  Chief Complaint  Patient presents with   Headache    Waking up with headaches in am and yesterday vomiting and had to take off work    HPI Patient is in today for headaches.  Patient states they started about 6 months ago but is increasing in severity and frequency.  She is now having headaches about 3-4 times a week.  She will wake up in the morning with the headaches, sometimes waking up in the early morning hours with pain.  Sometimes the headaches will resolve on their own but mostly respond to ibuprofen and Excedrin.  Yesterday she had such severe pain that she was nauseous, vomited and had a call out of work.  Eventually the pain subsided at 7 PM.  The pain is located across her forehead across her bilateral temples and radiates into her neck.  The patient recently got a new pillow to help with neck pain at night.    Review of Systems  Constitutional:  Negative for chills and fever.  Eyes:  Negative for blurred vision.  Neurological:  Positive for headaches.        Objective:    BP 118/82   Pulse 91   Temp 98.1 F (36.7 C)   Resp 16   Ht 5\' 2"  (1.575 m)   Wt 150 lb 1.6 oz (68.1 kg)   SpO2 99%   BMI 27.45 kg/m  BP Readings from Last 3 Encounters:  03/05/23 118/82  10/14/21 124/80  01/28/21 124/78   Wt Readings from Last 3 Encounters:  03/05/23 150 lb 1.6 oz (68.1 kg)  10/14/21 171 lb 1.6 oz (77.6 kg)  01/28/21 174 lb 12.8 oz (79.3 kg)      Physical Exam Constitutional:      Appearance: She is well-developed.  HENT:     Head: Normocephalic and atraumatic.  Eyes:     Conjunctiva/sclera: Conjunctivae normal.  Cardiovascular:     Rate and Rhythm: Normal rate and regular rhythm.  Pulmonary:     Effort: Pulmonary effort is normal.     Breath sounds: Normal breath sounds.  Skin:    General: Skin is warm and dry.  Neurological:      General: No focal deficit present.     Mental Status: She is alert. Mental status is at baseline.  Psychiatric:        Mood and Affect: Mood normal.        Behavior: Behavior normal.     No results found for any visits on 03/05/23.      Assessment & Plan:   1. Chronic tension-type headache, intractable: Symptoms consistent with chronic severe tension headaches, most likely coming from neck straining at night.  Agree with supportive neck pillow, mattress as well as cervical stretches before bed.  Will treat headaches with naproxen 500 mg as needed and muscle relaxer to take before bedtime.  Can also use heating pad, massage and topical medications like Bengay or IcyHot.  - naproxen (NAPROSYN) 500 MG tablet; Take 1 tablet (500 mg total) by mouth daily as needed for headache.  Dispense: 30 tablet; Refill: 1 - tiZANidine (ZANAFLEX) 4 MG tablet; Take 1 tablet (4 mg total) by mouth at bedtime as needed for muscle spasms.  Dispense: 30 tablet; Refill: 1  Return for sometime in tne next 1-6 months for CPE.  [name]  [name], DO

## 2023-03-05 ENCOUNTER — Ambulatory Visit (INDEPENDENT_AMBULATORY_CARE_PROVIDER_SITE_OTHER): Payer: No Typology Code available for payment source | Admitting: Internal Medicine

## 2023-03-05 ENCOUNTER — Encounter: Payer: Self-pay | Admitting: Internal Medicine

## 2023-03-05 VITALS — BP 118/82 | HR 91 | Temp 98.1°F | Resp 16 | Ht 62.0 in | Wt 150.1 lb

## 2023-03-05 DIAGNOSIS — G44221 Chronic tension-type headache, intractable: Secondary | ICD-10-CM

## 2023-03-05 MED ORDER — TIZANIDINE HCL 4 MG PO TABS: 4.0000 mg | ORAL_TABLET | Freq: Every evening | ORAL | 1 refills | Status: DC | PRN

## 2023-03-05 MED ORDER — NAPROXEN 500 MG PO TABS: 500.0000 mg | ORAL_TABLET | Freq: Every day | ORAL | 1 refills | Status: DC | PRN

## 2023-03-05 NOTE — Patient Instructions (Addendum)
It was great seeing you today!  Plan discussed at today's visit: -Recommend muscle relaxer at night before bed and Naproxen as needed at the onset pain -Also recommend topicals like BenGay, IcyHot, Voltaren gel, heating pad, massage, gentle stretching   Follow up in: 1-6 months for physical and labs  Take care and let us know if you have any questions or concerns prior to your next visit.  Dr. [name]  Tension Headache, Adult A tension headache is a feeling of pain, pressure, or aching over the front and sides of the head. The pain can be dull, or it can feel tight. There are two types of tension headache: Episodic tension headache. This is when the headaches happen fewer than 15 days a month. Chronic tension headache. This is when the headaches happen more than 15 days a month during a 63-month period. A tension headache can last from 30 minutes to several days. It is the most common kind of headache. Tension headaches are not normally associated with nausea or vomiting, and they do not get worse with physical activity. What are the causes? The exact cause of this condition is not known. Tension headaches are often triggered by stress, anxiety, or depression. Other triggers may include: Alcohol. Too much caffeine or caffeine withdrawal. Respiratory infections, such as colds, flu, or sinus infections. Dental problems or teeth clenching. Fatigue. Holding your head and neck in the same position for a long period of time, such as while using a computer. Smoking. Arthritis of the neck. What are the signs or symptoms? Symptoms of this condition include: A feeling of pressure or tightness around the head. Dull, aching head pain. Pain over the front and sides of the head. Tenderness in the muscles of the head, neck, and shoulders. How is this diagnosed? This condition may be diagnosed based on your symptoms, your medical history, and a physical exam. If your symptoms are severe or unusual,  you may have imaging tests, such as a CT scan or an MRI of your head. Your vision may also be checked. How is this treated? This condition may be treated with lifestyle changes and with medicines that help relieve symptoms. Follow these instructions at home: Managing pain Take over-the-counter and prescription medicines only as told by your health care provider. When you have a headache, lie down in a dark, quiet room. If directed, put ice on your head and neck. To do this: Put ice in a plastic bag. Place a towel between your skin and the bag. Leave the ice on for 20 minutes, 2-3 times a day. Remove the ice if your skin turns bright red. This is very important. If you cannot feel pain, heat, or cold, you have a greater risk of damage to the area. If directed, apply heat to the back of your neck as often as told by your health care provider. Use the heat source that your health care provider recommends, such as a moist heat pack or a heating pad. Place a towel between your skin and the heat source. Leave the heat on for 20-30 minutes. Remove the heat if your skin turns bright red. This is especially important if you are unable to feel pain, heat, or cold. You have a greater risk of getting burned. Eating and drinking Eat meals on a regular schedule. If you drink alcohol: Limit how much you have to: 0-1 drink a day for women who are not pregnant. 0-2 drinks a day for men. Know how much alcohol is  in your drink. In the [place] one drink equals one 12 oz bottle of beer (355 mL), one 5 oz glass of wine (148 mL), or one 1 oz glass of hard liquor (44 mL). Drink enough fluid to keep your urine pale yellow. Decrease your caffeine intake, or stop using caffeine. Lifestyle Get 7-9 hours of sleep each night, or get the amount of sleep recommended by your health care provider. At bedtime, remove computers, phones, and tablets from your room. Find ways to manage your stress. This may  include: Exercise. Deep breathing exercises. Yoga. Listening to music. Positive mental imagery. Try to sit up straight and avoid tensing your muscles. Do not use any products that contain nicotine or tobacco. These include cigarettes, chewing tobacco, and vaping devices, such as e-cigarettes. If you need help quitting, ask your health care provider. General instructions  Avoid any headache triggers. Keep a journal to help find out what may trigger your headaches. For example, write down: What you eat and drink. How much sleep you get. Any change to your diet or medicines. Keep all follow-up visits. This is important. Contact a health care provider if: Your headache does not get better. Your headache comes back. You are sensitive to sounds, light, or smells because of a headache. You have nausea or you vomit. Your stomach hurts. Get help right away if: You suddenly develop a severe headache, along with any of the following: A stiff neck. Nausea and vomiting. Confusion. Weakness in one part or one side of your body. Double vision or loss of vision. Shortness of breath. Rash. Unusual sleepiness. Fever or chills. Trouble speaking. Pain in your eye or ear. Trouble walking or balancing. Feeling faint or passing out. Summary A tension headache is a feeling of pain, pressure, or aching over the front and sides of the head. A tension headache can last from 30 minutes to several days. It is the most common kind of headache. This condition may be diagnosed based on your symptoms, your medical history, and a physical exam. This condition may be treated with lifestyle changes and with medicines that help relieve symptoms. This information is not intended to replace advice given to you by your health care provider. Make sure you discuss any questions you have with your health care provider. Document Revised: 04/18/2020 Document Reviewed: 04/18/2020 Elsevier Patient Education  2024  [employer].

## 2023-03-30 ENCOUNTER — Encounter: Payer: No Typology Code available for payment source | Admitting: Internal Medicine

## 2023-03-30 NOTE — Progress Notes (Deleted)
Name: Sheri Garrison   MRN: [id number]    DOB: 07/10/1976   Date:03/30/2023       Progress Note  Subjective  Chief Complaint  No chief complaint on file.   HPI  Patient presents for annual CPE.  Diet: *** Exercise: ***  Last Eye Exam: *** Last Dental Exam: ***  Flowsheet Row Office Visit from 02/09/2020 in [facility]  AUDIT-C Score 0      Depression: Phq 9 is  {Desc; negative/positive:13464}    03/05/2023    3:50 PM 10/14/2021   10:34 AM 09/05/2021    8:01 AM 01/28/2021   10:36 AM 09/25/2020    2:34 PM  Depression screen PHQ 2/9  Decreased Interest 0 3 3 0 0  Down, Depressed, Hopeless 0 2 3 0 0  PHQ - 2 Score 0 5 6 0 0  Altered sleeping 0 3 3 0 0  Tired, decreased energy 0 0 3 0 1  Change in appetite 0 3 3 0 0  Feeling bad or failure about yourself  0 1 3 0 0  Trouble concentrating 0 3 3 0 0  Moving slowly or fidgety/restless 0 1 0 0 0  Suicidal thoughts 0 0 0 0 0  PHQ-9 Score 0 16 21 0 1  Difficult doing work/chores Not difficult at all Very difficult Extremely dIfficult Not difficult at all Not difficult at all   Hypertension: BP Readings from Last 3 Encounters:  03/05/23 118/82  10/14/21 124/80  01/28/21 124/78   Obesity: Wt Readings from Last 3 Encounters:  03/05/23 150 lb 1.6 oz (68.1 kg)  10/14/21 171 lb 1.6 oz (77.6 kg)  01/28/21 174 lb 12.8 oz (79.3 kg)   BMI Readings from Last 3 Encounters:  03/05/23 27.45 kg/m  10/14/21 31.29 kg/m  01/28/21 31.97 kg/m     Vaccines:   HPV: aged out Tdap: 04/29/18 Shingrix: n/a Pneumonia: n/a Flu: due COVID-19:n/a   Hep C Screening: complete STD testing and prevention (HIV/chl/gon/syphilis): HIV: complete Intimate partner violence: {Desc; negative/positive:13464} screen  Sexual History : Menstrual History/LMP/Abnormal Bleeding:  Discussed importance of follow up if any post-menopausal bleeding: {Response; yes/no/na:63}  Incontinence Symptoms: {Desc; negative/positive:13464} for  symptoms   Breast cancer:  - Last Mammogram: 10/27/22 - BRCA gene screening:   Osteoporosis Prevention : Discussed high calcium and vitamin D supplementation, weight bearing exercises Bone density :not applicable   Cervical cancer screening: due today  Skin cancer: Discussed monitoring for atypical lesions  Colorectal cancer: due   Lung cancer:  Low Dose CT Chest recommended if Age 31-80 years, 20 pack-year currently smoking OR have quit w/in 15years. Patient does not qualify for screen   ECG: 04/26/13  Advanced Care Planning: A voluntary discussion about advance care planning including the explanation and discussion of advance directives.  Discussed health care proxy and Living will, and the patient was able to identify a health care proxy as ***.  Patient {DOES_DOES WUJ:81191} have a living will and power of attorney of health care   Lipids: Lab Results  Component Value Date   CHOL 156 09/25/2020   CHOL 229 (H) 02/09/2020   CHOL 203 (H) 03/23/2017   Lab Results  Component Value Date   HDL 54 09/25/2020   HDL 87 02/09/2020   HDL 74 03/23/2017   Lab Results  Component Value Date   LDLCALC 77 09/25/2020   LDLCALC 122 (H) 02/09/2020   LDLCALC 112 (H) 03/23/2017   Lab Results  Component Value Date  TRIG 156 (H) 09/25/2020   TRIG 92 02/09/2020   TRIG 83 03/23/2017   Lab Results  Component Value Date   CHOLHDL 2.9 09/25/2020   CHOLHDL 2.6 02/09/2020   CHOLHDL 2.7 03/23/2017   No results found for: "LDLDIRECT"  Glucose: Glucose, Bld  Date Value Ref Range Status  02/09/2020 79 65 - 99 mg/dL Final    Comment:    .            Fasting reference interval .   04/29/2018 78 65 - 139 mg/dL Final    Comment:    .        Non-fasting reference interval .   03/23/2017 86 65 - 99 mg/dL Final    Patient Active Problem List   Diagnosis Date Noted   Neck pain 09/25/2020   Other insomnia 02/09/2020   Grief reaction 02/09/2020   Iron deficiency anemia 03/23/2017    Depression with anxiety 06/04/2016   Anti-TPO antibodies present 01/14/2016   Panic disorder without agoraphobia 01/03/2016   Hypothyroidism, adult 04/09/2015    Past Surgical History:  Procedure Laterality Date   CHOLECYSTECTOMY  2004   LAPAROSCOPIC GASTRIC SLEEVE RESECTION  2014    Family History  Problem Relation Age of Onset   Heart disease Mother    Hyperlipidemia Mother    Hypertension Mother    Breast cancer Mother 94   Hearing loss Father        due to age   Arthritis Maternal Grandmother    Hyperlipidemia Maternal Grandmother    Hypertension Maternal Grandmother    Dementia Maternal Grandmother    Heart disease Maternal Grandfather    Hyperlipidemia Maternal Grandfather    Hypertension Maternal Grandfather    Stroke Paternal Grandmother    Heart disease Paternal Grandfather    Depression Maternal Aunt    Hyperlipidemia Maternal Aunt    Hypertension Maternal Aunt    Breast cancer Maternal Aunt        33's    Social History   Socioeconomic History   Marital status: Widowed    Spouse name: Not on file   Number of children: Not on file   Years of education: Not on file   Highest education level: Not on file  Occupational History   Not on file  Tobacco Use   Smoking status: Never   Smokeless tobacco: Never  Vaping Use   Vaping status: Never Used  Substance and Sexual Activity   Alcohol use: Yes    Alcohol/week: 0.0 standard drinks of alcohol    Comment: occasionally   Drug use: No   Sexual activity: Not Currently    Partners: Male    Birth control/protection: None  Other Topics Concern   Not on file  Social History Narrative   Husband passed away suddenly 08/16/2019   Social Determinants of Health   Financial Resource Strain: Not on file  Food Insecurity: Not on file  Transportation Needs: Not on file  Physical Activity: Not on file  Stress: Not on file  Social Connections: Not on file  Intimate Partner Violence: Not on file     Current  Outpatient Medications:    ALPRAZolam (XANAX) 0.25 MG tablet, Take 1 tablet (0.25 mg total) by mouth 2 (two) times daily as needed for anxiety., Disp: 20 tablet, Rfl: 0   ARMOUR THYROID 120 MG tablet, Take 120 mg by mouth daily., Disp: , Rfl:    buPROPion (WELLBUTRIN XL) 300 MG 24 hr tablet, TAKE 1 TABLET BY MOUTH  DAILY, Disp: 30 tablet, Rfl: 11   hydrOXYzine (ATARAX) 10 MG tablet, TAKE 1 TABLET(10 MG) BY MOUTH DAILY AS NEEDED FOR ANXIETY, Disp: 90 tablet, Rfl: 0   levothyroxine (SYNTHROID) 125 MCG tablet, TAKE 1 TABLET(125 MCG) BY MOUTH DAILY, Disp: 90 tablet, Rfl: 2   medroxyPROGESTERone (DEPO-PROVERA) 150 MG/ML injection, Inject 1 mL (150 mg total) into the muscle every 3 (three) months., Disp: 1 mL, Rfl: 4   Multiple Vitamin (MULTIVITAMIN) tablet, Take 1 tablet by mouth daily., Disp: , Rfl:    naproxen (NAPROSYN) 500 MG tablet, Take 1 tablet (500 mg total) by mouth daily as needed for headache., Disp: 30 tablet, Rfl: 1   ondansetron (ZOFRAN) 4 MG tablet, Take 1 tablet (4 mg total) by mouth every 8 (eight) hours as needed for nausea or vomiting., Disp: 30 tablet, Rfl: 0   sertraline (ZOLOFT) 100 MG tablet, TAKE 1 TABLET(100 MG) BY MOUTH DAILY, Disp: 30 tablet, Rfl: 3   sertraline (ZOLOFT) 50 MG tablet, TAKE 2 TABLETS BY MOUTH  DAILY, Disp: 180 tablet, Rfl: 0   tiZANidine (ZANAFLEX) 4 MG tablet, Take 1 tablet (4 mg total) by mouth at bedtime as needed for muscle spasms., Disp: 30 tablet, Rfl: 1  Allergies  Allergen Reactions   Alka-Seltzer Plus Cold [Chlorphen-Phenyleph-Asa] Swelling   Tessalon [Benzonatate] Hives and Swelling     ROS  ***  Objective  There were no vitals filed for this visit.  There is no height or weight on file to calculate BMI.  Physical Exam ***  No results found for this or any previous visit (from the past 2160 hour(s)).   Fall Risk:    03/05/2023    3:50 PM 10/14/2021   10:32 AM 09/05/2021    8:00 AM 01/28/2021   10:36 AM 02/09/2020    3:04 PM  Fall  Risk   Falls in the past year? 0 0 0 0 0  Number falls in past yr: 0 0 0 0 0  Injury with Fall? 0 0 0 0 0  Risk for fall due to :   No Fall Risks    Follow up   Falls prevention discussed     ***  Functional Status Survey:   ***  Assessment & Plan  There are no diagnoses linked to this encounter.  -USPSTF grade A and B recommendations reviewed with patient; age-appropriate recommendations, preventive care, screening tests, etc discussed and encouraged; healthy living encouraged; see AVS for patient education given to patient -Discussed importance of 150 minutes of physical activity weekly, eat two servings of fish weekly, eat one serving of tree nuts ( cashews, pistachios, pecans, almonds.Marland Kitchen) every other day, eat 6 servings of fruit/vegetables daily and drink plenty of water and avoid sweet beverages.   -Reviewed Health Maintenance: {yes XB:[id number]}

## 2023-04-23 ENCOUNTER — Other Ambulatory Visit: Payer: Self-pay | Admitting: Internal Medicine

## 2023-04-23 DIAGNOSIS — F418 Other specified anxiety disorders: Secondary | ICD-10-CM

## 2023-04-26 NOTE — Telephone Encounter (Signed)
Requested Prescriptions  Pending Prescriptions Disp Refills   sertraline (ZOLOFT) 50 MG tablet [Pharmacy Med Name: Sertraline HCl 50 MG Oral Tablet] 180 tablet 0    Sig: TAKE 2 TABLETS BY MOUTH DAILY     Psychiatry:  Antidepressants - SSRI - sertraline Failed - 04/23/2023  4:29 PM      Failed - AST in normal range and within 360 days    AST  Date Value Ref Range Status  02/09/2020 19 10 - 35 U/L Final         Failed - ALT in normal range and within 360 days    ALT  Date Value Ref Range Status  02/09/2020 17 6 - 29 U/L Final         Passed - Completed PHQ-2 or PHQ-9 in the last 360 days      Passed - Valid encounter within last 6 months    Recent Outpatient Visits           1 month ago Chronic tension-type headache, intractable   [facility] Margarita Mail, DO   1 year ago Depression with anxiety   [facility] Margarita Mail, DO   1 year ago Grief   [facility] Margarita Mail, DO   2 years ago Muscle strain   [facility] Caro Laroche, DO   2 years ago Hypothyroidism, adult   [facility] Caro Laroche, DO       Future Appointments             In 3 weeks Margarita Mail, DO [facility], [facility]

## 2023-04-29 NOTE — Progress Notes (Deleted)
Name: Sheri Garrison   MRN: [id number]    DOB: [birth year]   Date:04/29/2023       Progress Note  Subjective  Chief Complaint  No chief complaint on file.   HPI  Patient presents for annual CPE.  Diet: *** Exercise: ***  Last Eye Exam: *** Last Dental Exam: ***  Flowsheet Row Office Visit from 02/09/2020 in [facility]  AUDIT-C Score 0      Depression: Phq 9 is  {Desc; negative/positive:13464}    03/05/2023    3:50 PM 10/14/2021   10:34 AM 09/05/2021    8:01 AM 01/28/2021   10:36 AM 09/25/2020    2:34 PM  Depression screen PHQ 2/9  Decreased Interest 0 3 3 0 0  Down, Depressed, Hopeless 0 2 3 0 0  PHQ - 2 Score 0 5 6 0 0  Altered sleeping 0 3 3 0 0  Tired, decreased energy 0 0 3 0 1  Change in appetite 0 3 3 0 0  Feeling bad or failure about yourself  0 1 3 0 0  Trouble concentrating 0 3 3 0 0  Moving slowly or fidgety/restless 0 1 0 0 0  Suicidal thoughts 0 0 0 0 0  PHQ-9 Score 0 16 21 0 1  Difficult doing work/chores Not difficult at all Very difficult Extremely dIfficult Not difficult at all Not difficult at all   Hypertension: BP Readings from Last 3 Encounters:  03/05/23 118/82  10/14/21 124/80  01/28/21 124/78   Obesity: Wt Readings from Last 3 Encounters:  03/05/23 150 lb 1.6 oz (68.1 kg)  10/14/21 171 lb 1.6 oz (77.6 kg)  01/28/21 174 lb 12.8 oz (79.3 kg)   BMI Readings from Last 3 Encounters:  03/05/23 27.45 kg/m  10/14/21 31.29 kg/m  01/28/21 31.97 kg/m     Vaccines:   HPV: Aged out Tdap: 04/29/18 Shingrix: N/a Pneumonia: N/a Flu: Due COVID-19:Refused   Hep C Screening: Complete STD testing and prevention (HIV/chl/gon/syphilis): HIV: Complete Intimate partner violence: {Desc; negative/positive:13464} screen  Sexual History : Menstrual History/LMP/Abnormal Bleeding:  Discussed importance of follow up if any post-menopausal bleeding: {Response; yes/no/na:63}  Incontinence Symptoms: {Desc; negative/positive:13464}  for symptoms   Breast cancer:  - Last Mammogram: 10/27/22 - BRCA gene screening:   Osteoporosis Prevention : Discussed high calcium and vitamin D supplementation, weight bearing exercises Bone density :not applicable   Cervical cancer screening: 02/09/20 repeat 5 years  Skin cancer: Discussed monitoring for atypical lesions  Colorectal cancer:due \/ordered   Lung cancer:  Low Dose CT Chest recommended if Age 60-80 years, 20 pack-year currently smoking OR have quit w/in 15years. Patient does not qualify for screen   ECG: 04/26/13  Advanced Care Planning: A voluntary discussion about advance care planning including the explanation and discussion of advance directives.  Discussed health care proxy and Living will, and the patient was able to identify a health care proxy as ***.  Patient {DOES_DOES WUJ:81191} have a living will and power of attorney of health care   Lipids: Lab Results  Component Value Date   CHOL 156 09/25/2020   CHOL 229 (H) 02/09/2020   CHOL 203 (H) 03/23/2017   Lab Results  Component Value Date   HDL 54 09/25/2020   HDL 87 02/09/2020   HDL 74 03/23/2017   Lab Results  Component Value Date   LDLCALC 77 09/25/2020   LDLCALC 122 (H) 02/09/2020   LDLCALC 112 (H) 03/23/2017   Lab Results  Component  Value Date   TRIG 156 (H) 09/25/2020   TRIG 92 02/09/2020   TRIG 83 03/23/2017   Lab Results  Component Value Date   CHOLHDL 2.9 09/25/2020   CHOLHDL 2.6 02/09/2020   CHOLHDL 2.7 03/23/2017   No results found for: "LDLDIRECT"  Glucose: Glucose, Bld  Date Value Ref Range Status  02/09/2020 79 65 - 99 mg/dL Final    Comment:    .            Fasting reference interval .   04/29/2018 78 65 - 139 mg/dL Final    Comment:    .        Non-fasting reference interval .   03/23/2017 86 65 - 99 mg/dL Final    Patient Active Problem List   Diagnosis Date Noted   Neck pain 09/25/2020   Other insomnia 02/09/2020   Grief reaction 02/09/2020   Iron  deficiency anemia 03/23/2017   Depression with anxiety 06/04/2016   Anti-TPO antibodies present 01/14/2016   Panic disorder without agoraphobia 01/03/2016   Hypothyroidism, adult 04/09/2015    Past Surgical History:  Procedure Laterality Date   CHOLECYSTECTOMY  2004   LAPAROSCOPIC GASTRIC SLEEVE RESECTION  2014    Family History  Problem Relation Age of Onset   Heart disease Mother    Hyperlipidemia Mother    Hypertension Mother    Breast cancer Mother 35   Hearing loss Father        due to age   Arthritis Maternal Grandmother    Hyperlipidemia Maternal Grandmother    Hypertension Maternal Grandmother    Dementia Maternal Grandmother    Heart disease Maternal Grandfather    Hyperlipidemia Maternal Grandfather    Hypertension Maternal Grandfather    Stroke Paternal Grandmother    Heart disease Paternal Grandfather    Depression Maternal Aunt    Hyperlipidemia Maternal Aunt    Hypertension Maternal Aunt    Breast cancer Maternal Aunt        22's    Social History   Socioeconomic History   Marital status: Widowed    Spouse name: Not on file   Number of children: Not on file   Years of education: Not on file   Highest education level: Not on file  Occupational History   Not on file  Tobacco Use   Smoking status: Never   Smokeless tobacco: Never  Vaping Use   Vaping status: Never Used  Substance and Sexual Activity   Alcohol use: Yes    Alcohol/week: 0.0 standard drinks of alcohol    Comment: occasionally   Drug use: No   Sexual activity: Not Currently    Partners: Male    Birth control/protection: None  Other Topics Concern   Not on file  Social History Narrative   Husband passed away suddenly August 16, 2019   Social Determinants of Health   Financial Resource Strain: Not on file  Food Insecurity: Not on file  Transportation Needs: Not on file  Physical Activity: Not on file  Stress: Not on file  Social Connections: Not on file  Intimate Partner Violence:  Not on file     Current Outpatient Medications:    ALPRAZolam (XANAX) 0.25 MG tablet, Take 1 tablet (0.25 mg total) by mouth 2 (two) times daily as needed for anxiety., Disp: 20 tablet, Rfl: 0   ARMOUR THYROID 120 MG tablet, Take 120 mg by mouth daily., Disp: , Rfl:    buPROPion (WELLBUTRIN XL) 300 MG 24 hr tablet, TAKE  1 TABLET BY MOUTH DAILY, Disp: 30 tablet, Rfl: 11   hydrOXYzine (ATARAX) 10 MG tablet, TAKE 1 TABLET(10 MG) BY MOUTH DAILY AS NEEDED FOR ANXIETY, Disp: 90 tablet, Rfl: 0   levothyroxine (SYNTHROID) 125 MCG tablet, TAKE 1 TABLET(125 MCG) BY MOUTH DAILY, Disp: 90 tablet, Rfl: 2   medroxyPROGESTERone (DEPO-PROVERA) 150 MG/ML injection, Inject 1 mL (150 mg total) into the muscle every 3 (three) months., Disp: 1 mL, Rfl: 4   Multiple Vitamin (MULTIVITAMIN) tablet, Take 1 tablet by mouth daily., Disp: , Rfl:    naproxen (NAPROSYN) 500 MG tablet, Take 1 tablet (500 mg total) by mouth daily as needed for headache., Disp: 30 tablet, Rfl: 1   ondansetron (ZOFRAN) 4 MG tablet, Take 1 tablet (4 mg total) by mouth every 8 (eight) hours as needed for nausea or vomiting., Disp: 30 tablet, Rfl: 0   sertraline (ZOLOFT) 100 MG tablet, TAKE 1 TABLET(100 MG) BY MOUTH DAILY, Disp: 30 tablet, Rfl: 3   sertraline (ZOLOFT) 50 MG tablet, TAKE 2 TABLETS BY MOUTH DAILY, Disp: 180 tablet, Rfl: 0   tiZANidine (ZANAFLEX) 4 MG tablet, Take 1 tablet (4 mg total) by mouth at bedtime as needed for muscle spasms., Disp: 30 tablet, Rfl: 1  Allergies  Allergen Reactions   Alka-Seltzer Plus Cold [Chlorphen-Phenyleph-Asa] Swelling   Tessalon [Benzonatate] Hives and Swelling     ROS  ***  Objective  There were no vitals filed for this visit.  There is no height or weight on file to calculate BMI.  Physical Exam ***  No results found for this or any previous visit (from the past 2160 hour(s)).   Fall Risk:    03/05/2023    3:50 PM 10/14/2021   10:32 AM 09/05/2021    8:00 AM 01/28/2021   10:36 AM  02/09/2020    3:04 PM  Fall Risk   Falls in the past year? 0 0 0 0 0  Number falls in past yr: 0 0 0 0 0  Injury with Fall? 0 0 0 0 0  Risk for fall due to :   No Fall Risks    Follow up   Falls prevention discussed     ***  Functional Status Survey:   ***  Assessment & Plan  There are no diagnoses linked to this encounter.  -USPSTF grade A and B recommendations reviewed with patient; age-appropriate recommendations, preventive care, screening tests, etc discussed and encouraged; healthy living encouraged; see AVS for patient education given to patient -Discussed importance of 150 minutes of physical activity weekly, eat two servings of fish weekly, eat one serving of tree nuts ( cashews, pistachios, pecans, almonds.Marland Kitchen) every other day, eat 6 servings of fruit/vegetables daily and drink plenty of water and avoid sweet beverages.   -Reviewed Health Maintenance: {yes [id number]}

## 2023-05-04 ENCOUNTER — Other Ambulatory Visit: Payer: Self-pay | Admitting: Internal Medicine

## 2023-05-04 DIAGNOSIS — G44221 Chronic tension-type headache, intractable: Secondary | ICD-10-CM

## 2023-05-04 NOTE — Telephone Encounter (Signed)
Requested medications are due for refill today.  yes  Requested medications are on the active medications list.  yes  Last refill. 03/05/2023 #30 1 rf  Future visit scheduled.   yes  Notes to clinic.  Refill not delegated.    Requested Prescriptions  Pending Prescriptions Disp Refills   tiZANidine (ZANAFLEX) 4 MG tablet [Pharmacy Med Name: TIZANIDINE 4MG  TABLETS] 30 tablet 1    Sig: TAKE 1 TABLET(4 MG) BY MOUTH AT BEDTIME AS NEEDED FOR MUSCLE SPASMS     Not Delegated - Cardiovascular:  Alpha-2 Agonists - tizanidine Failed - 05/04/2023  3:36 AM      Failed - This refill cannot be delegated      Passed - Valid encounter within last 6 months    Recent Outpatient Visits           2 months ago Chronic tension-type headache, intractable   [facility] Margarita Mail, DO   1 year ago Depression with anxiety   [facility] Margarita Mail, DO   1 year ago Grief   [facility] Margarita Mail, DO   2 years ago Muscle strain   [facility] Caro Laroche, DO   2 years ago Hypothyroidism, adult   [facility] Caro Laroche, DO       Future Appointments             In 1 week Margarita Mail, DO [facility], [facility]

## 2023-05-17 ENCOUNTER — Ambulatory Visit
Admission: RE | Admit: 2023-05-17 | Discharge: 2023-05-17 | Disposition: A | Discharge status: Home / Self Care | Payer: No Typology Code available for payment source | Source: Ambulatory Visit | Attending: Internal Medicine | Admitting: Internal Medicine

## 2023-05-17 ENCOUNTER — Ambulatory Visit (INDEPENDENT_AMBULATORY_CARE_PROVIDER_SITE_OTHER): Payer: No Typology Code available for payment source | Admitting: Internal Medicine

## 2023-05-17 ENCOUNTER — Other Ambulatory Visit
Admission: RE | Admit: 2023-05-17 | Discharge: 2023-05-17 | Disposition: A | Discharge status: Home / Self Care | Payer: No Typology Code available for payment source | Source: Ambulatory Visit | Attending: *Deleted | Admitting: *Deleted

## 2023-05-17 ENCOUNTER — Encounter: Payer: Self-pay | Admitting: Internal Medicine

## 2023-05-17 ENCOUNTER — Other Ambulatory Visit: Payer: Self-pay | Admitting: Internal Medicine

## 2023-05-17 VITALS — BP 110/68 | HR 120 | Temp 98.0°F | Resp 18 | Ht 62.0 in

## 2023-05-17 DIAGNOSIS — R109 Unspecified abdominal pain: Secondary | ICD-10-CM | POA: Insufficient documentation

## 2023-05-17 DIAGNOSIS — R1013 Epigastric pain: Secondary | ICD-10-CM | POA: Insufficient documentation

## 2023-05-17 DIAGNOSIS — N12 Tubulo-interstitial nephritis, not specified as acute or chronic: Secondary | ICD-10-CM

## 2023-05-17 DIAGNOSIS — R1032 Left lower quadrant pain: Secondary | ICD-10-CM | POA: Insufficient documentation

## 2023-05-17 DIAGNOSIS — R110 Nausea: Secondary | ICD-10-CM | POA: Insufficient documentation

## 2023-05-17 DIAGNOSIS — G44221 Chronic tension-type headache, intractable: Secondary | ICD-10-CM

## 2023-05-17 DIAGNOSIS — Z1211 Encounter for screening for malignant neoplasm of colon: Secondary | ICD-10-CM

## 2023-05-17 LAB — CBC WITH DIFFERENTIAL/PLATELET
Abs Immature Granulocytes: 0.12 10*3/uL — ABNORMAL HIGH (ref 0.00–0.07)
Basophils Absolute: 0.1 10*3/uL (ref 0.0–0.1)
Basophils Relative: 0 %
Eosinophils Absolute: 0 10*3/uL (ref 0.0–0.5)
Eosinophils Relative: 0 %
HCT: 38.3 % (ref 36.0–46.0)
Hemoglobin: 13.3 g/dL (ref 12.0–15.0)
Immature Granulocytes: 1 %
Lymphocytes Relative: 3 %
Lymphs Abs: 0.7 10*3/uL (ref 0.7–4.0)
MCH: 29.5 pg (ref 26.0–34.0)
MCHC: 34.7 g/dL (ref 30.0–36.0)
MCV: 84.9 fL (ref 80.0–100.0)
Monocytes Absolute: 1.4 10*3/uL — ABNORMAL HIGH (ref 0.1–1.0)
Monocytes Relative: 6 %
Neutro Abs: 20 10*3/uL — ABNORMAL HIGH (ref 1.7–7.7)
Neutrophils Relative %: 90 %
Platelets: 336 10*3/uL (ref 150–400)
RBC: 4.51 MIL/uL (ref 3.87–5.11)
RDW: 12.5 % (ref 11.5–15.5)
WBC: 22.3 10*3/uL — ABNORMAL HIGH (ref 4.0–10.5)
nRBC: 0 % (ref 0.0–0.2)

## 2023-05-17 LAB — POCT URINALYSIS DIPSTICK
Bilirubin, UA: NEGATIVE
Blood, UA: POSITIVE
Glucose, UA: NEGATIVE
Ketones, UA: NEGATIVE
Nitrite, UA: NEGATIVE
Odor: NORMAL
Protein, UA: POSITIVE — AB
Spec Grav, UA: 1.02 (ref 1.010–1.025)
Urobilinogen, UA: 0.2 U/dL
pH, UA: 7 (ref 5.0–8.0)

## 2023-05-17 LAB — BASIC METABOLIC PANEL
Anion gap: 13 (ref 5–15)
BUN: 12 mg/dL (ref 6–20)
CO2: 22 mmol/L (ref 22–32)
Calcium: 9.1 mg/dL (ref 8.9–10.3)
Chloride: 98 mmol/L (ref 98–111)
Creatinine, Ser: 0.83 mg/dL (ref 0.44–1.00)
GFR, Estimated: >60 mL/min (ref >60)
Glucose, Bld: 102 mg/dL — ABNORMAL HIGH (ref 70–99)
Potassium: 3.3 mmol/L — ABNORMAL LOW (ref 3.5–5.1)
Sodium: 133 mmol/L — ABNORMAL LOW (ref 135–145)

## 2023-05-17 LAB — LIPASE, BLOOD: Lipase: 29 U/L (ref 11–51)

## 2023-05-17 MED ORDER — CEFTRIAXONE SODIUM 500 MG IJ SOLR
1000.0000 mg | Freq: Once | INTRAMUSCULAR | Status: AC
Start: 2023-05-17 — End: 2023-05-17
  Administered 2023-05-17: 1000 mg via INTRAMUSCULAR

## 2023-05-17 MED ORDER — CEFTRIAXONE SODIUM 1 G IJ SOLR
1.0000 g | Freq: Once | INTRAMUSCULAR | Status: DC
Start: 2023-05-17 — End: 2023-05-17

## 2023-05-17 MED ORDER — NAPROXEN 500 MG PO TABS
500.0000 mg | ORAL_TABLET | Freq: Every day | ORAL | 1 refills | Status: DC | PRN
Start: 2023-05-17 — End: 2024-06-12

## 2023-05-17 MED ORDER — ONDANSETRON 4 MG PO TBDP: 4.0000 mg | ORAL_TABLET | Freq: Three times a day (TID) | ORAL | 0 refills | Status: DC | PRN

## 2023-05-17 MED ORDER — LIDOCAINE HCL (PF) 1 % IJ SOLN
2.0000 mL | Freq: Once | INTRAMUSCULAR | Status: AC
Start: 2023-05-17 — End: 2023-05-17
  Administered 2023-05-17: 2 mL via INTRADERMAL

## 2023-05-17 MED ORDER — SULFAMETHOXAZOLE-TRIMETHOPRIM 800-160 MG PO TABS
1.0000 | ORAL_TABLET | Freq: Two times a day (BID) | ORAL | 0 refills | Status: DC
Start: 2023-05-17 — End: 2023-05-24

## 2023-05-17 NOTE — Addendum Note (Signed)
Addended by: Dollene Primrose on: 05/17/2023 10:47 AM   Modules accepted: Orders

## 2023-05-17 NOTE — Addendum Note (Signed)
Addended by: Margarita Mail on: 05/17/2023 02:39 PM   Modules accepted: Orders

## 2023-05-17 NOTE — Progress Notes (Signed)
Acute Office Visit  Subjective:     Patient ID: Sheri Garrison, female    DOB: [birth year], 47 y.o.   MRN: [id number]  Chief Complaint  Patient presents with   Back Pain    With chills   Abdominal Pain    W/ nausea and possible constipation    HPI Patient is in today for abdominal pain. Symptoms started Saturday, woke up feeling bloated, constipated, nauseous. Had a BM after taking milk of magnesia and bloating improved. Now having left flank pain radiating to the groin. Taking tylenol which helps. Also has body aches, chills. No documented fevers. Nauseous, no vomiting. A couple weeks ago had some dysuria, took some azo and it resolved. No urinary symptoms now. On compounded semugltide, has been on stable dose for 6 months. Thinks is on 2.4 mg dose. Some hiccups, bloating. Last injection on Tuesday.   Review of Systems  Constitutional:  Positive for chills. Negative for fever.  Respiratory:  Negative for cough and shortness of breath.   Gastrointestinal:  Positive for abdominal pain, diarrhea and nausea. Negative for blood in stool, heartburn and vomiting.  Genitourinary:  Positive for flank pain. Negative for dysuria, frequency, hematuria and urgency.  Musculoskeletal:  Positive for myalgias.        Objective:    Pulse (!) 120   Temp 98 F (36.7 C)   Resp 18   Ht 5\' 2"  (1.575 m)   SpO2 98%   BMI 27.45 kg/m  BP Readings from Last 3 Encounters:  03/05/23 118/82  10/14/21 124/80  01/28/21 124/78   Wt Readings from Last 3 Encounters:  03/05/23 150 lb 1.6 oz (68.1 kg)  10/14/21 171 lb 1.6 oz (77.6 kg)  01/28/21 174 lb 12.8 oz (79.3 kg)      Physical Exam Constitutional:      Appearance: Normal appearance. She is ill-appearing.  HENT:     Head: Normocephalic and atraumatic.  Eyes:     Conjunctiva/sclera: Conjunctivae normal.  Cardiovascular:     Rate and Rhythm: Regular rhythm. Tachycardia present.  Pulmonary:     Effort: Pulmonary effort is normal.     Breath  sounds: Normal breath sounds.  Abdominal:     General: Bowel sounds are normal. There is no distension.     Palpations: Abdomen is soft. There is no mass.     Tenderness: There is abdominal tenderness. There is left CVA tenderness and guarding. There is no right CVA tenderness or rebound.     Hernia: No hernia is present.     Comments: Mild epigastric tenderness on exam but painful and guarding with palpation in the left lower quadrant   Skin:    General: Skin is warm and dry.  Neurological:     General: No focal deficit present.     Mental Status: She is alert. Mental status is at baseline.  Psychiatric:        Mood and Affect: Mood normal.        Behavior: Behavior normal.     Results for orders placed or performed in visit on 05/17/23  POCT urinalysis dipstick  Result Value Ref Range   Color, UA orange    Clarity, UA clody    Glucose, UA Negative Negative   Bilirubin, UA neg    Ketones, UA neg    Spec Grav, UA 1.020 1.010 - 1.025   Blood, UA positive    pH, UA 7.0 5.0 - 8.0   Protein, UA Positive (A) Negative  Urobilinogen, UA 0.2 0.2 or 1.0 E.U./dL   Nitrite, UA neg    Leukocytes, UA Large (3+) (A) Negative   Appearance cloudy    Odor normal         Assessment & Plan:   1. Acute left flank pain/Left lower quadrant abdominal pain/Epigastric pain/Nausea: UA here with blood, leukocytes and protein, no nitrates. Concern for pyelonephritis vs. Left renal stone. Will obtain stat labs and stat CT. Will send urine for culture and treat with Ceftriaxone 1 g here today. Zofran prescribed to take as needed. Mild epigastric pain on exam, is on compounded semaglutide, will rule out pancreatitis with lipase today but less concerning.   - POCT urinalysis dipstick - Urine Culture - ondansetron (ZOFRAN-ODT) 4 MG disintegrating tablet; Take 1 tablet (4 mg total) by mouth every 8 (eight) hours as needed for nausea or vomiting.  Dispense: 20 tablet; Refill: 0 - CBC w/Diff/Platelet;  Future - Basic Metabolic Panel (BMET); Future - CT RENAL STONE STUDY; Future - cefTRIAXone (ROCEPHIN) injection 1 g - Lipase, blood; Future   Return in about 1 week (around 05/24/2023).  Margarita Mail, DO

## 2023-05-19 LAB — URINE CULTURE
MICRO NUMBER:: 15591366
SPECIMEN QUALITY:: ADEQUATE

## 2023-05-20 ENCOUNTER — Encounter: Payer: Self-pay | Admitting: Internal Medicine

## 2023-05-20 ENCOUNTER — Emergency Department
Admission: EM | Admit: 2023-05-20 | Discharge: 2023-05-20 | Disposition: A | Discharge status: Home / Self Care | Payer: No Typology Code available for payment source | Attending: Emergency Medicine | Admitting: Emergency Medicine

## 2023-05-20 ENCOUNTER — Other Ambulatory Visit: Payer: Self-pay | Admitting: Internal Medicine

## 2023-05-20 ENCOUNTER — Other Ambulatory Visit: Payer: Self-pay

## 2023-05-20 DIAGNOSIS — N12 Tubulo-interstitial nephritis, not specified as acute or chronic: Secondary | ICD-10-CM | POA: Diagnosis not present

## 2023-05-20 DIAGNOSIS — R110 Nausea: Secondary | ICD-10-CM | POA: Diagnosis present

## 2023-05-20 LAB — CBC WITH DIFFERENTIAL/PLATELET
Abs Immature Granulocytes: 0.06 10*3/uL (ref 0.00–0.07)
Basophils Absolute: 0.1 10*3/uL (ref 0.0–0.1)
Basophils Relative: 1 %
Eosinophils Absolute: 0.2 10*3/uL (ref 0.0–0.5)
Eosinophils Relative: 2 %
HCT: 34.9 % — ABNORMAL LOW (ref 36.0–46.0)
Hemoglobin: 12.1 g/dL (ref 12.0–15.0)
Immature Granulocytes: 1 %
Lymphocytes Relative: 13 %
Lymphs Abs: 1.7 10*3/uL (ref 0.7–4.0)
MCH: 29.2 pg (ref 26.0–34.0)
MCHC: 34.7 g/dL (ref 30.0–36.0)
MCV: 84.1 fL (ref 80.0–100.0)
Monocytes Absolute: 0.8 10*3/uL (ref 0.1–1.0)
Monocytes Relative: 7 %
Neutro Abs: 9.9 10*3/uL — ABNORMAL HIGH (ref 1.7–7.7)
Neutrophils Relative %: 76 %
Platelets: 546 10*3/uL — ABNORMAL HIGH (ref 150–400)
RBC: 4.15 MIL/uL (ref 3.87–5.11)
RDW: 13 % (ref 11.5–15.5)
WBC: 12.7 10*3/uL — ABNORMAL HIGH (ref 4.0–10.5)
nRBC: 0 % (ref 0.0–0.2)

## 2023-05-20 LAB — COMPREHENSIVE METABOLIC PANEL
ALT: 31 U/L (ref 0–44)
AST: 15 U/L (ref 15–41)
Albumin: 3.3 g/dL — ABNORMAL LOW (ref 3.5–5.0)
Alkaline Phosphatase: 116 U/L (ref 38–126)
Anion gap: 12 (ref 5–15)
BUN: 12 mg/dL (ref 6–20)
CO2: 21 mmol/L — ABNORMAL LOW (ref 22–32)
Calcium: 8.9 mg/dL (ref 8.9–10.3)
Chloride: 102 mmol/L (ref 98–111)
Creatinine, Ser: 1 mg/dL (ref 0.44–1.00)
GFR, Estimated: >60 mL/min (ref >60)
Glucose, Bld: 109 mg/dL — ABNORMAL HIGH (ref 70–99)
Potassium: 3.3 mmol/L — ABNORMAL LOW (ref 3.5–5.1)
Sodium: 135 mmol/L (ref 135–145)
Total Bilirubin: 0.5 mg/dL (ref 0.3–1.2)
Total Protein: 7.6 g/dL (ref 6.5–8.1)

## 2023-05-20 LAB — URINALYSIS, W/ REFLEX TO CULTURE (INFECTION SUSPECTED)
Bilirubin Urine: NEGATIVE
Glucose, UA: NEGATIVE mg/dL
Ketones, ur: NEGATIVE mg/dL
Nitrite: NEGATIVE
Protein, ur: NEGATIVE mg/dL
Specific Gravity, Urine: 1.003 — ABNORMAL LOW (ref 1.005–1.030)
pH: 6 (ref 5.0–8.0)

## 2023-05-20 MED ORDER — LEVOFLOXACIN 500 MG PO TABS: 500.0000 mg | ORAL_TABLET | Freq: Every day | ORAL | 0 refills | Status: AC

## 2023-05-20 MED ORDER — ONDANSETRON HCL 4 MG/2ML IJ SOLN
4.0000 mg | Freq: Once | INTRAMUSCULAR | Status: AC
  Administered 2023-05-20: 4 mg via INTRAVENOUS
  Filled 2023-05-20: qty 2

## 2023-05-20 MED ORDER — LEVOFLOXACIN IN D5W 500 MG/100ML IV SOLN
500.0000 mg | Freq: Once | INTRAVENOUS | Status: AC
  Administered 2023-05-20: 500 mg via INTRAVENOUS
  Filled 2023-05-20: qty 100

## 2023-05-20 MED ORDER — SODIUM CHLORIDE 0.9 % IV BOLUS
1000.0000 mL | Freq: Once | INTRAVENOUS | Status: AC
  Administered 2023-05-20: 1000 mL via INTRAVENOUS

## 2023-05-20 MED ORDER — ONDANSETRON 4 MG PO TBDP: 4.0000 mg | ORAL_TABLET | Freq: Three times a day (TID) | ORAL | 0 refills | Status: DC | PRN

## 2023-05-20 MED ORDER — AMOXICILLIN-POT CLAVULANATE 875-125 MG PO TABS: 1.0000 | ORAL_TABLET | Freq: Two times a day (BID) | ORAL | 0 refills | Status: DC

## 2023-05-20 MED ORDER — KETOROLAC TROMETHAMINE 30 MG/ML IJ SOLN
30.0000 mg | Freq: Once | INTRAMUSCULAR | Status: AC
  Administered 2023-05-20: 30 mg via INTRAVENOUS
  Filled 2023-05-20: qty 1

## 2023-05-20 NOTE — ED Triage Notes (Signed)
Pt reports was seen at pcp on Monday and dx with kidney infection. Given Bactrim. Pt reports compliance with abx but reports pain is worse and pt has persistent nausea. Pt reports pain initially on L side and is now also on the R. Pt ambulatory to triage. Alert and oriented following commands. Breathing unlabored speaking in full sentences.

## 2023-05-20 NOTE — Discharge Instructions (Addendum)
As we discussed please discontinue use of your Bactrim.  Please start taking Levaquin starting tomorrow 05/21/2023 once a day for the next 5 days.  You may use Tylenol or ibuprofen as needed for discomfort you have been prescribed Zofran to be used if needed for nausea.  Please follow-up with your doctor in the next 2 days or so for recheck/reevaluation.  Return to the emergency department for any symptom personally concerning to yourself.

## 2023-05-20 NOTE — ED Provider Notes (Signed)
[facility] Provider Note    Event Date/Time   First MD Initiated Contact with Patient 05/20/23 2121     (approximate)  History   Chief Complaint: Nausea  HPI  Sheri Garrison is a 47 y.o. female with a past medical history of anxiety presents to the emergency department for continued left flank pain and weakness.  According to the patient and record review patient was seen 05/17/2023 for left flank pain ultimately diagnosed with pyelonephritis after CT scan showed pyelonephritis urine consistent with urinary tract infection and significant leukocytosis greater than 20,000.  Patient states she has taken her antibiotics since going home, was having fever for the first 2 days or so but is not had any since yesterday or the day before.  Continues to feel nauseated at times but has been tolerating her antibiotic.  Patient states she continues to feel weak and continues to feel left flank pain at times.  Patient thought she should be better by now she was concerned so she came to the emergency department for evaluation.  Physical Exam   Triage Vital Signs: ED Triage Vitals  Encounter Vitals Group     BP 05/20/23 2028 (!) 167/94     Systolic BP Percentile --      Diastolic BP Percentile --      Pulse Rate 05/20/23 2028 (!) 125     Resp 05/20/23 2028 18     Temp 05/20/23 2028 98.3 F (36.8 C)     Temp Source 05/20/23 2028 Oral     SpO2 05/20/23 2028 97 %     Weight 05/20/23 2025 140 lb (63.5 kg)     Height 05/20/23 2025 5\' 2"  (1.575 m)     Head Circumference --      Peak Flow --      Pain Score 05/20/23 2025 8     Pain Loc --      Pain Education --      Exclude from Growth Chart --     Most recent vital signs: Vitals:   05/20/23 2110 05/20/23 2125  BP: 135/88   Pulse: 80 78  Resp:    Temp:    SpO2: 98% 98%    General: Awake, no distress.  CV:  Good peripheral perfusion.  Regular rate and rhythm  Resp:  Normal effort.  Equal breath sounds  bilaterally.  Abd:  No distention.  Soft, nontender.  No rebound or guarding.  Mild left CVA tenderness to palpation.  No right-sided tenderness.  No anterior tenderness.  ED Results / Procedures / Treatments   MEDICATIONS ORDERED IN ED: Medications  levofloxacin (LEVAQUIN) IVPB 500 mg (has no administration in time range)  ketorolac (TORADOL) 30 MG/ML injection 30 mg (has no administration in time range)  ondansetron (ZOFRAN) injection 4 mg (has no administration in time range)  sodium chloride 0.9 % bolus 1,000 mL (has no administration in time range)     IMPRESSION / MDM / ASSESSMENT AND PLAN / ED COURSE  I reviewed the triage vital signs and the nursing notes.  Patient's presentation is most consistent with acute presentation with potential threat to life or bodily function.  Patient presents emergency department for left flank pain continued weakness and nausea after being diagnosed with pyelonephritis 3 days ago.  Patient does have mild left CVA tenderness to palpation.  Patient's lab work shows a mild leukocytosis of 12,700 however significantly decreased from 3 days ago, chemistry is reassuring with normal renal function,  urinalysis continues to show 21-50 white blood cells.  We will send a new urine culture today, I did review the patient's chart and her urine culture from 10/14 showed nearly pansensitive infection besides ampicillin.  Given the signs of pyelonephritis on CT imaging on 1014 we will transition the patient to a fluoroquinolone such as IV Levaquin while in the emergency department and discharged on oral Levaquin instead of her Bactrim which could be contributing to her nausea as well.  Given the patient's fatigue weakness and fever although no fever since yesterday we will send a set of blood cultures as a precaution.  Patient agreeable to plan of care and workup.  We will dose IV fluids Toradol and Zofran in addition to IV Levaquin while awaiting further results.  Patient  is feeling better after medications and fluids.  Will discharge on antibiotics, Levaquin instead of Bactrim.  Have the patient follow-up with her doctor.  Discussed return precautions.  FINAL CLINICAL IMPRESSION(S) / ED DIAGNOSES   Pyelonephritis  Rx / DC Orders   Levaquin Zofran DC Bactrim  Note:  This document was prepared using Dragon voice recognition software and may include unintentional dictation errors.   Minna Antis, MD 05/20/23 [phone]

## 2023-05-22 LAB — URINE CULTURE: Culture: >=10000 — AB

## 2023-05-22 NOTE — Plan of Care (Signed)
[facility] Tonsillectomy/Adenoidectomy, Postoperative PEDS care plan entered in error.

## 2023-05-23 NOTE — Progress Notes (Unsigned)
Acute Office Visit  Subjective:     Patient ID: Sheri Garrison, female    DOB: [birth year], 47 y.o.   MRN: [id number]  No chief complaint on file.   HPI Patient is in today for recheck on pyelonephritis. She was diagnosed last week after presenting with flank pain, myalgias. CT scan 05/17/23 showing pyelonephritis, urine culture with pan-sensitive e. Coli. Was given IM Ceftriaxone and Bactrim outpatient but patient still felt nauseous and went to ER where her urine and WBC had improved. The Bactrim was discontinued and she was given Levaquin instead.    Review of Systems  Constitutional:  Positive for chills. Negative for fever.  Respiratory:  Negative for cough and shortness of breath.   Gastrointestinal:  Positive for abdominal pain, diarrhea and nausea. Negative for blood in stool, heartburn and vomiting.  Genitourinary:  Positive for flank pain. Negative for dysuria, frequency, hematuria and urgency.  Musculoskeletal:  Positive for myalgias.        Objective:    There were no vitals taken for this visit. BP Readings from Last 3 Encounters:  05/20/23 132/82  05/17/23 110/68  03/05/23 118/82   Wt Readings from Last 3 Encounters:  05/20/23 140 lb (63.5 kg)  03/05/23 150 lb 1.6 oz (68.1 kg)  10/14/21 171 lb 1.6 oz (77.6 kg)      Physical Exam Constitutional:      Appearance: Normal appearance. She is ill-appearing.  HENT:     Head: Normocephalic and atraumatic.  Eyes:     Conjunctiva/sclera: Conjunctivae normal.  Cardiovascular:     Rate and Rhythm: Regular rhythm. Tachycardia present.  Pulmonary:     Effort: Pulmonary effort is normal.     Breath sounds: Normal breath sounds.  Abdominal:     General: Bowel sounds are normal. There is no distension.     Palpations: Abdomen is soft. There is no mass.     Tenderness: There is abdominal tenderness. There is left CVA tenderness and guarding. There is no right CVA tenderness or rebound.     Hernia: No hernia is  present.     Comments: Mild epigastric tenderness on exam but painful and guarding with palpation in the left lower quadrant   Skin:    General: Skin is warm and dry.  Neurological:     General: No focal deficit present.     Mental Status: She is alert. Mental status is at baseline.  Psychiatric:        Mood and Affect: Mood normal.        Behavior: Behavior normal.     No results found for any visits on 05/24/23.       Assessment & Plan:   1. Acute left flank pain/Left lower quadrant abdominal pain/Epigastric pain/Nausea: UA here with blood, leukocytes and protein, no nitrates. Concern for pyelonephritis vs. Left renal stone. Will obtain stat labs and stat CT. Will send urine for culture and treat with Ceftriaxone 1 g here today. Zofran prescribed to take as needed. Mild epigastric pain on exam, is on compounded semaglutide, will rule out pancreatitis with lipase today but less concerning.   - POCT urinalysis dipstick - Urine Culture - ondansetron (ZOFRAN-ODT) 4 MG disintegrating tablet; Take 1 tablet (4 mg total) by mouth every 8 (eight) hours as needed for nausea or vomiting.  Dispense: 20 tablet; Refill: 0 - CBC w/Diff/Platelet; Future - Basic Metabolic Panel (BMET); Future - CT RENAL STONE STUDY; Future - cefTRIAXone (ROCEPHIN) injection 1 g - Lipase, blood;  Future   No follow-ups on file.  Margarita Mail, DO

## 2023-05-24 ENCOUNTER — Ambulatory Visit (INDEPENDENT_AMBULATORY_CARE_PROVIDER_SITE_OTHER): Payer: No Typology Code available for payment source | Admitting: Internal Medicine

## 2023-05-24 ENCOUNTER — Encounter: Payer: Self-pay | Admitting: Internal Medicine

## 2023-05-24 VITALS — BP 130/86 | HR 89 | Temp 97.3°F | Resp 18 | Ht 62.0 in | Wt 140.0 lb

## 2023-05-24 DIAGNOSIS — N12 Tubulo-interstitial nephritis, not specified as acute or chronic: Secondary | ICD-10-CM | POA: Diagnosis not present

## 2023-05-24 LAB — POCT URINALYSIS DIPSTICK
Bilirubin, UA: NEGATIVE
Blood, UA: NEGATIVE
Glucose, UA: NEGATIVE
Ketones, UA: NEGATIVE
Leukocytes, UA: NEGATIVE
Nitrite, UA: NEGATIVE
Odor: NORMAL
Protein, UA: NEGATIVE
Spec Grav, UA: 1.025 (ref 1.010–1.025)
Urobilinogen, UA: 0.2 U/dL
pH, UA: 7 (ref 5.0–8.0)

## 2023-05-25 LAB — CULTURE, BLOOD (ROUTINE X 2)
Culture: NO GROWTH
Culture: NO GROWTH

## 2023-06-14 ENCOUNTER — Encounter: Payer: Self-pay | Admitting: Internal Medicine

## 2023-06-14 ENCOUNTER — Ambulatory Visit (INDEPENDENT_AMBULATORY_CARE_PROVIDER_SITE_OTHER): Payer: No Typology Code available for payment source | Admitting: Internal Medicine

## 2023-06-14 VITALS — BP 112/70 | HR 93 | Temp 97.8°F | Resp 16 | Ht 62.0 in | Wt 140.4 lb

## 2023-06-14 DIAGNOSIS — J069 Acute upper respiratory infection, unspecified: Secondary | ICD-10-CM | POA: Diagnosis not present

## 2023-06-14 MED ORDER — PSEUDOEPHEDRINE-CODEINE-GG 30-10-100 MG/5ML PO SOLN: 10.0000 mL | Freq: Two times a day (BID) | ORAL | 0 refills | Status: AC | PRN

## 2023-06-14 MED ORDER — FLUTICASONE PROPIONATE HFA 44 MCG/ACT IN AERO
1.0000 | INHALATION_SPRAY | Freq: Every day | RESPIRATORY_TRACT | 12 refills | Status: DC | PRN
Start: 2023-06-14 — End: 2023-06-14

## 2023-06-14 NOTE — Progress Notes (Signed)
Acute Office Visit  Subjective:     Patient ID: Sheri Garrison, female    DOB: February 16, 1976, 47 y.o.   MRN: [id number]  Chief Complaint  Patient presents with   Cough    Constant, has tried OTC dayquil/Nightquil no imporvement   Nasal Congestion    Onset for over a week,   Facial Pain    pressure    HPI Patient is in today for cough, congestion. Symptoms started 10 days ago.   URI Compliant:  -Fever: no -Cough: yes, productive yellow  -Shortness of breath: no -Wheezing: no -Chest tightness: yes -Chest congestion: yes -Nasal congestion: yes -Runny nose: yes, clear -Post nasal drip: no -Sneezing: yes -Sore throat: yes, resolved  -Sinus pressure: yes -Headache: yes -Face pain: no -Ear pain: no  -Ear pressure: yes bilateral -Sick contacts: no   -Context: fluctuating -Treatments attempted: cold/sinus, mucinex, anti-histamine, and cough syrup    Review of Systems  Constitutional:  Negative for chills and fever.  HENT:  Positive for congestion. Negative for ear pain and sore throat.   Respiratory:  Positive for cough, sputum production and wheezing. Negative for shortness of breath.         Objective:    BP 112/70   Pulse 93   Temp 97.8 F (36.6 C) (Oral)   Resp 16   Ht 5\' 2"  (1.575 m)   Wt 140 lb 6.4 oz (63.7 kg)   SpO2 98%   BMI 25.68 kg/m  BP Readings from Last 3 Encounters:  06/14/23 112/70  05/24/23 130/86  05/20/23 132/82   Wt Readings from Last 3 Encounters:  06/14/23 140 lb 6.4 oz (63.7 kg)  05/24/23 140 lb (63.5 kg)  05/20/23 140 lb (63.5 kg)      Physical Exam Constitutional:      Appearance: Normal appearance.  HENT:     Head: Normocephalic and atraumatic.     Right Ear: Ear canal and external ear normal.     Left Ear: Ear canal and external ear normal.     Ears:     Comments: Bilateral eustachian tube dysfunction present    Nose: Congestion present.     Mouth/Throat:     Mouth: Mucous membranes are moist.     Pharynx:  Oropharynx is clear.  Eyes:     Conjunctiva/sclera: Conjunctivae normal.  Cardiovascular:     Rate and Rhythm: Normal rate and regular rhythm.  Pulmonary:     Effort: Pulmonary effort is normal.     Breath sounds: No wheezing, rhonchi or rales.     Comments: Decreased air sounds throughout Skin:    General: Skin is warm and dry.  Neurological:     General: No focal deficit present.     Mental Status: She is alert. Mental status is at baseline.  Psychiatric:        Mood and Affect: Mood normal.        Behavior: Behavior normal.     No results found for any visits on 06/14/23.      Assessment & Plan:   1. Upper respiratory tract infection, unspecified type: Patient has an allergy to [facility] and to chloroephedrine, so will prescribe pseudoephedrine with codeine and guaifenesin to help with congestion and cough.  She was also given a sample of Trelegy to use as needed.  Discussed with patient how her symptoms are most likely viral and will resolve in the next few days, patient will call by the end of the week if  she is still having symptoms or if symptoms worsen.  - pseudoephedrine-codeine-guaifenesin (MYTUSSIN DAC) 30-10-100 MG/5ML solution; Take 10 mLs by mouth every 12 (twelve) hours as needed for up to 6 days for cough.  Dispense: 118 mL; Refill: 0   Return if symptoms worsen or fail to improve.  Margarita Mail, DO

## 2023-06-15 ENCOUNTER — Other Ambulatory Visit: Payer: Self-pay | Admitting: Internal Medicine

## 2023-06-15 DIAGNOSIS — J069 Acute upper respiratory infection, unspecified: Secondary | ICD-10-CM

## 2023-06-15 MED ORDER — GUAIFENESIN-CODEINE 100-10 MG/5ML PO SOLN
5.0000 mL | Freq: Two times a day (BID) | ORAL | 0 refills | Status: AC | PRN
Start: 2023-06-15 — End: 2023-06-20

## 2023-06-20 ENCOUNTER — Other Ambulatory Visit: Payer: Self-pay | Admitting: Internal Medicine

## 2023-06-20 DIAGNOSIS — F418 Other specified anxiety disorders: Secondary | ICD-10-CM

## 2023-06-21 NOTE — Telephone Encounter (Signed)
Requested Prescriptions  Pending Prescriptions Disp Refills   sertraline (ZOLOFT) 50 MG tablet [Pharmacy Med Name: Sertraline HCl 50 MG Oral Tablet] 180 tablet 1    Sig: TAKE 2 TABLETS BY MOUTH DAILY     Psychiatry:  Antidepressants - SSRI - sertraline Passed - 06/20/2023 10:27 PM      Passed - AST in normal range and within 360 days    AST  Date Value Ref Range Status  05/20/2023 15 15 - 41 U/L Final         Passed - ALT in normal range and within 360 days    ALT  Date Value Ref Range Status  05/20/2023 31 0 - 44 U/L Final         Passed - Completed PHQ-2 or PHQ-9 in the last 360 days      Passed - Valid encounter within last 6 months    Recent Outpatient Visits           1 week ago Upper respiratory tract infection, unspecified type   [facility] Margarita Mail, DO   4 weeks ago Pyelonephritis   [facility] Margarita Mail, DO   1 month ago Acute left flank pain   [facility] Margarita Mail, DO   3 months ago Chronic tension-type headache, intractable   [facility] Margarita Mail, DO   1 year ago Depression with anxiety   [facility] Margarita Mail, DO       Future Appointments             In 5 months Margarita Mail, DO [facility], [facility]

## 2023-07-12 ENCOUNTER — Other Ambulatory Visit: Payer: Self-pay | Admitting: Internal Medicine

## 2023-07-12 DIAGNOSIS — G44221 Chronic tension-type headache, intractable: Secondary | ICD-10-CM

## 2023-07-13 NOTE — Telephone Encounter (Signed)
Requested medication (s) are due for refill today: Yes  Requested medication (s) are on the active medication list: Yes  Last refill:  05/04/23  Future visit scheduled: Yes  Notes to clinic:  Unable to refill per protocol, cannot delegate.      Requested Prescriptions  Pending Prescriptions Disp Refills   tiZANidine (ZANAFLEX) 4 MG tablet [Pharmacy Med Name: TIZANIDINE 4MG  TABLETS] 30 tablet 1    Sig: TAKE 1 TABLET(4 MG) BY MOUTH AT BEDTIME AS NEEDED FOR MUSCLE SPASMS     Not Delegated - Cardiovascular:  Alpha-2 Agonists - tizanidine Failed - 07/12/2023  3:37 AM      Failed - This refill cannot be delegated      Passed - Valid encounter within last 6 months    Recent Outpatient Visits           4 weeks ago Upper respiratory tract infection, unspecified type   [facility] Margarita Mail, DO   1 month ago Pyelonephritis   [facility] Margarita Mail, DO   1 month ago Acute left flank pain   [facility] Margarita Mail, DO   4 months ago Chronic tension-type headache, intractable   [facility] Margarita Mail, DO   1 year ago Depression with anxiety   [facility] Margarita Mail, DO       Future Appointments             In 4 months Margarita Mail, DO [facility], [facility]

## 2023-09-23 ENCOUNTER — Other Ambulatory Visit: Payer: Self-pay | Admitting: Internal Medicine

## 2023-09-23 DIAGNOSIS — F418 Other specified anxiety disorders: Secondary | ICD-10-CM

## 2023-09-24 NOTE — Telephone Encounter (Signed)
 Requesting too soon, last ordered 12/23/22 #30, 11 refilled. Requested Prescriptions  Pending Prescriptions Disp Refills   buPROPion (WELLBUTRIN XL) 300 MG 24 hr tablet [Pharmacy Med Name: buPROPion HCl ER (XL) 300 MG Oral Tablet Extended Release 24 Hour] 30 tablet 11    Sig: TAKE 1 TABLET BY MOUTH DAILY     Psychiatry: Antidepressants - bupropion Passed - 09/24/2023  3:01 PM      Passed - Cr in normal range and within 360 days    Creat  Date Value Ref Range Status  02/09/2020 0.88 0.50 - 1.10 mg/dL Final   Creatinine, Ser  Date Value Ref Range Status  05/20/2023 1.00 0.44 - 1.00 mg/dL Final         Passed - AST in normal range and within 360 days    AST  Date Value Ref Range Status  05/20/2023 15 15 - 41 U/L Final         Passed - ALT in normal range and within 360 days    ALT  Date Value Ref Range Status  05/20/2023 31 0 - 44 U/L Final         Passed - Completed PHQ-2 or PHQ-9 in the last 360 days      Passed - Last BP in normal range    BP Readings from Last 1 Encounters:  06/14/23 112/70         Passed - Valid encounter within last 6 months    Recent Outpatient Visits           3 months ago Upper respiratory tract infection, unspecified type   [facility] Margarita Mail, DO   4 months ago Pyelonephritis   [facility] Margarita Mail, DO   4 months ago Acute left flank pain   [facility] Margarita Mail, DO   6 months ago Chronic tension-type headache, intractable   [facility] Margarita Mail, DO   1 year ago Depression with anxiety   [facility] Margarita Mail, DO       Future Appointments             In 2 months Margarita Mail, DO [facility], [facility]

## 2023-10-05 ENCOUNTER — Encounter: Payer: Self-pay | Admitting: Internal Medicine

## 2023-10-07 ENCOUNTER — Ambulatory Visit: Admitting: Internal Medicine

## 2023-10-21 ENCOUNTER — Other Ambulatory Visit: Payer: Self-pay | Admitting: Internal Medicine

## 2023-10-21 DIAGNOSIS — F418 Other specified anxiety disorders: Secondary | ICD-10-CM

## 2023-10-22 NOTE — Telephone Encounter (Signed)
 Requesting 1 year supply too soon.  Requested Prescriptions  Pending Prescriptions Disp Refills   buPROPion (WELLBUTRIN XL) 300 MG 24 hr tablet [Pharmacy Med Name: buPROPion HCl ER (XL) 300 MG Oral Tablet Extended Release 24 Hour] 30 tablet 11    Sig: TAKE 1 TABLET BY MOUTH DAILY     Psychiatry: Antidepressants - bupropion Passed - 10/22/2023  3:48 PM      Passed - Cr in normal range and within 360 days    Creat  Date Value Ref Range Status  02/09/2020 0.88 0.50 - 1.10 mg/dL Final   Creatinine, Ser  Date Value Ref Range Status  05/20/2023 1.00 0.44 - 1.00 mg/dL Final         Passed - AST in normal range and within 360 days    AST  Date Value Ref Range Status  05/20/2023 15 15 - 41 U/L Final         Passed - ALT in normal range and within 360 days    ALT  Date Value Ref Range Status  05/20/2023 31 0 - 44 U/L Final         Passed - Completed PHQ-2 or PHQ-9 in the last 360 days      Passed - Last BP in normal range    BP Readings from Last 1 Encounters:  06/14/23 112/70         Passed - Valid encounter within last 6 months    Recent Outpatient Visits           4 months ago Upper respiratory tract infection, unspecified type   [facility] Margarita Mail, DO   5 months ago Pyelonephritis   [facility] Margarita Mail, DO   5 months ago Acute left flank pain   [facility] Margarita Mail, DO   7 months ago Chronic tension-type headache, intractable   [facility] Margarita Mail, DO   2 years ago Depression with anxiety   [facility] Margarita Mail, DO       Future Appointments             In 1 month Margarita Mail, DO [facility], [facility]

## 2023-11-14 ENCOUNTER — Other Ambulatory Visit: Payer: Self-pay | Admitting: Internal Medicine

## 2023-11-14 DIAGNOSIS — G44221 Chronic tension-type headache, intractable: Secondary | ICD-10-CM

## 2023-11-15 ENCOUNTER — Other Ambulatory Visit: Payer: Self-pay | Admitting: Internal Medicine

## 2023-11-15 DIAGNOSIS — F418 Other specified anxiety disorders: Secondary | ICD-10-CM

## 2023-11-16 NOTE — Telephone Encounter (Signed)
 Requested medications are due for refill today.  yes  Requested medications are on the active medications list.  yes  Last refill. 07/14/2023 #30 1 rf  Future visit scheduled.   yes  Notes to clinic.  Refill not delegated.    Requested Prescriptions  Pending Prescriptions Disp Refills   tiZANidine (ZANAFLEX) 4 MG tablet [Pharmacy Med Name: TIZANIDINE 4MG  TABLETS] 30 tablet 1    Sig: TAKE 1 TABLET(4 MG) BY MOUTH AT BEDTIME AS NEEDED FOR MUSCLE SPASMS     Not Delegated - Cardiovascular:  Alpha-2 Agonists - tizanidine Failed - 11/16/2023  7:39 AM      Failed - This refill cannot be delegated      Failed - Valid encounter within last 6 months    Recent Outpatient Visits   None     Future Appointments             In 1 week Rockney Cid, DO [place] [facility], [facility]

## 2023-11-16 NOTE — Telephone Encounter (Signed)
 Requested Prescriptions  Pending Prescriptions Disp Refills   sertraline (ZOLOFT) 50 MG tablet [Pharmacy Med Name: Sertraline HCl 50 MG Oral Tablet] 180 tablet 0    Sig: TAKE 2 TABLETS BY MOUTH DAILY     Psychiatry:  Antidepressants - SSRI - sertraline Failed - 11/16/2023  4:17 PM      Failed - Valid encounter within last 6 months    Recent Outpatient Visits   None     Future Appointments             In 1 week Rockney Cid, DO [place] [facility], [facility]            Passed - AST in normal range and within 360 days    AST  Date Value Ref Range Status  05/20/2023 15 15 - 41 U/L Final         Passed - ALT in normal range and within 360 days    ALT  Date Value Ref Range Status  05/20/2023 31 0 - 44 U/L Final         Passed - Completed PHQ-2 or PHQ-9 in the last 360 days

## 2023-11-16 NOTE — Telephone Encounter (Signed)
 Pt has an appt on 11/23/23

## 2023-11-18 ENCOUNTER — Other Ambulatory Visit: Payer: Self-pay | Admitting: Internal Medicine

## 2023-11-18 DIAGNOSIS — F418 Other specified anxiety disorders: Secondary | ICD-10-CM

## 2023-11-19 NOTE — Telephone Encounter (Signed)
 Courtesy refill. Patient must keep upcoming appointment for additional refills. Requested Prescriptions  Pending Prescriptions Disp Refills   buPROPion  (WELLBUTRIN  XL) 300 MG 24 hr tablet [Pharmacy Med Name: buPROPion  HCl ER (XL) 300 MG Oral Tablet Extended Release 24 Hour] 30 tablet 0    Sig: TAKE 1 TABLET BY MOUTH DAILY     Psychiatry: Antidepressants - bupropion  Failed - 11/19/2023 12:06 PM      Failed - Valid encounter within last 6 months    Recent Outpatient Visits   None     Future Appointments             In 4 days Bernardo Fend, DO [place] [facility], [facility]            Passed - Cr in normal range and within 360 days    Creat  Date Value Ref Range Status  02/09/2020 0.88 0.50 - 1.10 mg/dL Final   Creatinine, Ser  Date Value Ref Range Status  05/20/2023 1.00 0.44 - 1.00 mg/dL Final         Passed - AST in normal range and within 360 days    AST  Date Value Ref Range Status  05/20/2023 15 15 - 41 U/L Final         Passed - ALT in normal range and within 360 days    ALT  Date Value Ref Range Status  05/20/2023 31 0 - 44 U/L Final         Passed - Completed PHQ-2 or PHQ-9 in the last 360 days      Passed - Last BP in normal range    BP Readings from Last 1 Encounters:  06/14/23 112/70

## 2023-11-23 ENCOUNTER — Encounter: Payer: No Typology Code available for payment source | Admitting: Internal Medicine

## 2023-12-12 ENCOUNTER — Other Ambulatory Visit: Payer: Self-pay | Admitting: Internal Medicine

## 2023-12-12 DIAGNOSIS — F418 Other specified anxiety disorders: Secondary | ICD-10-CM

## 2023-12-14 NOTE — Telephone Encounter (Signed)
 Requested medication (s) are due for refill today:   Yes  Requested medication (s) are on the active medication list:   Yes  Future visit scheduled:   No.  On 10/07/2023 was a No Show and on 11/23/2023 she cancelled.   LOV 06/14/2023 for an acute issue.   Last ordered: 11/19/2023 #30, 0 refills as a courtesy fill.  Unable to refill as courtesy refill has been given.     Requested Prescriptions  Pending Prescriptions Disp Refills   buPROPion  (WELLBUTRIN  XL) 300 MG 24 hr tablet [Pharmacy Med Name: buPROPion  HCl ER (XL) 300 MG Oral Tablet Extended Release 24 Hour] 30 tablet 11    Sig: TAKE 1 TABLET BY MOUTH DAILY     Psychiatry: Antidepressants - bupropion  Failed - 12/14/2023  2:33 PM      Failed - Valid encounter within last 6 months    Recent Outpatient Visits   None            Passed - Cr in normal range and within 360 days    Creat  Date Value Ref Range Status  02/09/2020 0.88 0.50 - 1.10 mg/dL Final   Creatinine, Ser  Date Value Ref Range Status  05/20/2023 1.00 0.44 - 1.00 mg/dL Final         Passed - AST in normal range and within 360 days    AST  Date Value Ref Range Status  05/20/2023 15 15 - 41 U/L Final         Passed - ALT in normal range and within 360 days    ALT  Date Value Ref Range Status  05/20/2023 31 0 - 44 U/L Final         Passed - Completed PHQ-2 or PHQ-9 in the last 360 days      Passed - Last BP in normal range    BP Readings from Last 1 Encounters:  06/14/23 112/70

## 2024-01-20 ENCOUNTER — Other Ambulatory Visit: Payer: Self-pay | Admitting: Internal Medicine

## 2024-01-20 DIAGNOSIS — G44221 Chronic tension-type headache, intractable: Secondary | ICD-10-CM

## 2024-01-21 NOTE — Telephone Encounter (Signed)
 Requested medications are due for refill today.  yes  Requested medications are on the active medications list.  yes  Last refill. 11/16/2023 #30 1 rf  Future visit scheduled.   no  Notes to clinic.  Refill not delegated.    Requested Prescriptions  Pending Prescriptions Disp Refills   tiZANidine  (ZANAFLEX ) 4 MG tablet [Pharmacy Med Name: TIZANIDINE  4MG  TABLETS] 30 tablet 1    Sig: TAKE 1 TABLET(4 MG) BY MOUTH AT BEDTIME AS NEEDED FOR MUSCLE SPASMS     Not Delegated - Cardiovascular:  Alpha-2 Agonists - tizanidine  Failed - 01/21/2024  4:39 PM      Failed - This refill cannot be delegated      Failed - Valid encounter within last 6 months    Recent Outpatient Visits   None

## 2024-01-22 ENCOUNTER — Other Ambulatory Visit: Payer: Self-pay | Admitting: Internal Medicine

## 2024-01-22 DIAGNOSIS — F418 Other specified anxiety disorders: Secondary | ICD-10-CM

## 2024-01-25 NOTE — Telephone Encounter (Signed)
 Courtesy refill. Patient will need an office visit for additional refills.  Requested Prescriptions  Pending Prescriptions Disp Refills   sertraline  (ZOLOFT ) 50 MG tablet [Pharmacy Med Name: Sertraline  HCl 50 MG Oral Tablet] 60 tablet 0    Sig: TAKE 2 TABLETS BY MOUTH DAILY     Psychiatry:  Antidepressants - SSRI - sertraline  Failed - 01/25/2024 12:59 PM      Failed - Valid encounter within last 6 months    Recent Outpatient Visits   None            Passed - AST in normal range and within 360 days    AST  Date Value Ref Range Status  05/20/2023 15 15 - 41 U/L Final         Passed - ALT in normal range and within 360 days    ALT  Date Value Ref Range Status  05/20/2023 31 0 - 44 U/L Final         Passed - Completed PHQ-2 or PHQ-9 in the last 360 days

## 2024-02-14 ENCOUNTER — Other Ambulatory Visit: Payer: Self-pay | Admitting: Internal Medicine

## 2024-02-14 DIAGNOSIS — F418 Other specified anxiety disorders: Secondary | ICD-10-CM

## 2024-02-16 NOTE — Telephone Encounter (Signed)
 Requested Prescriptions  Pending Prescriptions Disp Refills   hydrOXYzine  (ATARAX ) 10 MG tablet [Pharmacy Med Name: HYDROXYZINE  HCL 10MG  TABLETS] 90 tablet 0    Sig: TAKE 1 TABLET(10 MG) BY MOUTH DAILY AS NEEDED FOR ANXIETY     Ear, Nose, and Throat:  Antihistamines 2 Failed - 02/16/2024 12:00 PM      Failed - Valid encounter within last 12 months    Recent Outpatient Visits   None            Passed - Cr in normal range and within 360 days    Creat  Date Value Ref Range Status  02/09/2020 0.88 0.50 - 1.10 mg/dL Final   Creatinine, Ser  Date Value Ref Range Status  05/20/2023 1.00 0.44 - 1.00 mg/dL Final

## 2024-02-18 ENCOUNTER — Other Ambulatory Visit: Payer: Self-pay | Admitting: Internal Medicine

## 2024-02-18 DIAGNOSIS — F418 Other specified anxiety disorders: Secondary | ICD-10-CM

## 2024-02-21 NOTE — Telephone Encounter (Signed)
 OV 05/24/23- last fill was courtesy refill Requested Prescriptions  Pending Prescriptions Disp Refills   sertraline  (ZOLOFT ) 50 MG tablet [Pharmacy Med Name: Sertraline  HCl 50 MG Oral Tablet] 60 tablet 11    Sig: TAKE 2 TABLETS BY MOUTH DAILY     Psychiatry:  Antidepressants - SSRI - sertraline  Failed - 02/21/2024  4:29 PM      Failed - Valid encounter within last 6 months    Recent Outpatient Visits   None            Passed - AST in normal range and within 360 days    AST  Date Value Ref Range Status  05/20/2023 15 15 - 41 U/L Final         Passed - ALT in normal range and within 360 days    ALT  Date Value Ref Range Status  05/20/2023 31 0 - 44 U/L Final         Passed - Completed PHQ-2 or PHQ-9 in the last 360 days

## 2024-03-18 ENCOUNTER — Other Ambulatory Visit: Payer: Self-pay | Admitting: Internal Medicine

## 2024-03-18 DIAGNOSIS — G44221 Chronic tension-type headache, intractable: Secondary | ICD-10-CM

## 2024-03-21 NOTE — Telephone Encounter (Signed)
 Requested medication (s) are due for refill today:   Provider to review  Requested medication (s) are on the active medication list:   Yes  Future visit scheduled:   No.   LOV 06/14/2023.  11/23/2023 cancelled.    Was a No Show for another appt.   Last ordered: 01/21/2024 #30, 1 refill  Non delegated refill    Requested Prescriptions  Pending Prescriptions Disp Refills   tiZANidine  (ZANAFLEX ) 4 MG tablet [Pharmacy Med Name: TIZANIDINE  4MG  TABLETS] 30 tablet 1    Sig: TAKE 1 TABLET(4 MG) BY MOUTH AT BEDTIME AS NEEDED FOR MUSCLE SPASMS     Not Delegated - Cardiovascular:  Alpha-2 Agonists - tizanidine  Failed - 03/21/2024  3:11 PM      Failed - This refill cannot be delegated      Failed - Valid encounter within last 6 months    Recent Outpatient Visits   None

## 2024-06-01 ENCOUNTER — Other Ambulatory Visit: Payer: Self-pay | Admitting: Internal Medicine

## 2024-06-01 DIAGNOSIS — G44221 Chronic tension-type headache, intractable: Secondary | ICD-10-CM

## 2024-06-02 NOTE — Telephone Encounter (Signed)
 Requested medications are due for refill today.  yes  Requested medications are on the active medications list.  yes  Last refill. 03/21/2024 #30 1 rf  Future visit scheduled.   no  Notes to clinic.  Refill not delegated.    Requested Prescriptions  Pending Prescriptions Disp Refills   tiZANidine  (ZANAFLEX ) 4 MG tablet [Pharmacy Med Name: TIZANIDINE  4MG  TABLETS] 30 tablet 1    Sig: TAKE 1 TABLET(4 MG) BY MOUTH AT BEDTIME AS NEEDED FOR MUSCLE SPASMS     Not Delegated - Cardiovascular:  Alpha-2 Agonists - tizanidine  Failed - 06/02/2024  2:20 PM      Failed - This refill cannot be delegated      Failed - Valid encounter within last 6 months    Recent Outpatient Visits   None

## 2024-06-12 ENCOUNTER — Encounter: Payer: Self-pay | Admitting: Internal Medicine

## 2024-06-12 ENCOUNTER — Ambulatory Visit (INDEPENDENT_AMBULATORY_CARE_PROVIDER_SITE_OTHER): Admitting: Internal Medicine

## 2024-06-12 ENCOUNTER — Other Ambulatory Visit: Payer: Self-pay

## 2024-06-12 VITALS — BP 120/72 | HR 100 | Temp 98.1°F | Resp 16 | Ht 62.0 in | Wt 128.3 lb

## 2024-06-12 DIAGNOSIS — R5382 Chronic fatigue, unspecified: Secondary | ICD-10-CM | POA: Diagnosis not present

## 2024-06-12 DIAGNOSIS — Z1211 Encounter for screening for malignant neoplasm of colon: Secondary | ICD-10-CM

## 2024-06-12 DIAGNOSIS — E063 Autoimmune thyroiditis: Secondary | ICD-10-CM | POA: Diagnosis not present

## 2024-06-12 DIAGNOSIS — Z1231 Encounter for screening mammogram for malignant neoplasm of breast: Secondary | ICD-10-CM

## 2024-06-12 DIAGNOSIS — M2550 Pain in unspecified joint: Secondary | ICD-10-CM

## 2024-06-12 DIAGNOSIS — E559 Vitamin D deficiency, unspecified: Secondary | ICD-10-CM

## 2024-06-12 DIAGNOSIS — L659 Nonscarring hair loss, unspecified: Secondary | ICD-10-CM

## 2024-06-12 DIAGNOSIS — F418 Other specified anxiety disorders: Secondary | ICD-10-CM

## 2024-06-12 MED ORDER — SERTRALINE HCL 100 MG PO TABS: 100.0000 mg | ORAL_TABLET | Freq: Every day | ORAL | 1 refills | Status: AC

## 2024-06-12 MED ORDER — BUPROPION HCL ER (XL) 150 MG PO TB24: 300.0000 mg | ORAL_TABLET | Freq: Every day | ORAL | 1 refills | Status: DC

## 2024-06-12 NOTE — Progress Notes (Signed)
 Established Patient Office Visit  Subjective:  Patient ID: Sheri Garrison, female    DOB: [birth year]  Age: 48 y.o. MRN: [id number]  CC:  Chief Complaint  Patient presents with   Fatigue    For 2 months    HPI Sheri Garrison presents for follow up on chronic medical conditions and fatigue.   Discussed the use of AI scribe software for clinical note transcription with the patient, who gave verbal consent to proceed.  History of Present Illness Sheri Garrison is a 48 year old female with Hashimoto's thyroiditis and alopecia who presents with fatigue and body pain.  She experiences significant fatigue and body pain affecting her arms, legs, neck, shoulders, back, and hips, sometimes causing emotional distress. She manages the discomfort by lying down every few hours.  She is on Armour Thyroid  120 mg, managed by a specialist, with labs scheduled for tomorrow. She also takes progesterone 200 mg and has used testosterone pellets, though she is behind on the latter treatment.  She has alopecia, autoimmune in nature, and uses minoxidil and low-dose naltrexone. She is on Zoloft  100 mg and previously used Wellbutrin . She takes tizanidine  for muscle relaxation, which has reduced headache severity.   Hashimoto's/Hypothyroidism: -Medications: Armour Thyroid  120 mg has been on chronically now -Patient is compliant with the above medication (s) at the above dose and reports no medication side effects.  -Denies weight changes, cold./heat intolerance, skin changes, anxiety/palpitations  -Last TSH: had checked last week at specialists office, will send results here when she gets them  MDD/Anxiety: -Duration: Zoloft  100 mg, had been on Wellbutrin  300 mg XL but hasn't been on for 1 year but wants to start taking it again -Patient is compliant with the above medications at above dose and reports no side effects.      06/14/2023    2:37 PM 05/24/2023   10:52 AM 05/17/2023   10:05 AM 03/05/2023     3:50 PM 10/14/2021   10:34 AM  Depression screen PHQ 2/9  Decreased Interest 0 0 0 0 3  Down, Depressed, Hopeless 0 0 0 0 2  PHQ - 2 Score 0 0 0 0 5  Altered sleeping 0 0 0 0 3  Tired, decreased energy 0 0 0 0 0  Change in appetite 0 0 0 0 3  Feeling bad or failure about yourself  0 0 0 0 1  Trouble concentrating 0 0 0 0 3  Moving slowly or fidgety/restless 0 0 0 0 1  Suicidal thoughts 0 0 0 0 0  PHQ-9 Score 0  0  0  0  16   Difficult doing work/chores Not difficult at all Not difficult at all Not difficult at all Not difficult at all Very difficult     Data saved with a previous flowsheet row definition   Health Maintenance: -Blood work due -Mammogram due -Colon cancer screening due -Declines flu vaccine    Past Medical History:  Diagnosis Date   Anxiety    Chronic anxiety 01/03/2016   Heart palpitations 04/09/2015   Hypothyroidism, adult 04/09/2015   Panic disorder without agoraphobia 01/03/2016    Past Surgical History:  Procedure Laterality Date   CHOLECYSTECTOMY  2004   LAPAROSCOPIC GASTRIC SLEEVE RESECTION  2014    Family History  Problem Relation Age of Onset   Heart disease Mother    Hyperlipidemia Mother    Hypertension Mother    Breast cancer Mother 62   Hearing loss Father  due to age   Arthritis Maternal Grandmother    Hyperlipidemia Maternal Grandmother    Hypertension Maternal Grandmother    Dementia Maternal Grandmother    Heart disease Maternal Grandfather    Hyperlipidemia Maternal Grandfather    Hypertension Maternal Grandfather    Stroke Paternal Grandmother    Heart disease Paternal Grandfather    Depression Maternal Aunt    Hyperlipidemia Maternal Aunt    Hypertension Maternal Aunt    Breast cancer Maternal Aunt        15's    Social History   Socioeconomic History   Marital status: Widowed    Spouse name: Not on file   Number of children: Not on file   Years of education: Not on file   Highest education level: Not on file   Occupational History   Not on file  Tobacco Use   Smoking status: Never   Smokeless tobacco: Never  Vaping Use   Vaping status: Never Used  Substance and Sexual Activity   Alcohol use: Yes    Alcohol/week: 0.0 standard drinks of alcohol    Comment: occasionally   Drug use: No   Sexual activity: Not Currently    Partners: Male    Birth control/protection: None  Other Topics Concern   Not on file  Social History Narrative   Husband passed away suddenly July 27, 2019   Social Drivers of Health   Financial Resource Strain: Not on file  Food Insecurity: Not on file  Transportation Needs: Not on file  Physical Activity: Not on file  Stress: Not on file  Social Connections: Not on file  Intimate Partner Violence: Not on file    Outpatient Medications Prior to Visit  Medication Sig Dispense Refill   ARMOUR THYROID  120 MG tablet Take 120 mg by mouth daily.     minoxidil (LONITEN) 2.5 MG tablet Take 2.5 mg by mouth daily.     Multiple Vitamin (MULTIVITAMIN) tablet Take 1 tablet by mouth daily.     ondansetron  (ZOFRAN -ODT) 4 MG disintegrating tablet Take 1 tablet (4 mg total) by mouth every 8 (eight) hours as needed for nausea or vomiting. 20 tablet 0   progesterone (PROMETRIUM) 200 MG capsule Take 200 mg by mouth at bedtime.     sertraline  (ZOLOFT ) 100 MG tablet TAKE 1 TABLET(100 MG) BY MOUTH DAILY 30 tablet 3   tiZANidine  (ZANAFLEX ) 4 MG tablet TAKE 1 TABLET(4 MG) BY MOUTH AT BEDTIME AS NEEDED FOR MUSCLE SPASMS 30 tablet 1   ALPRAZolam  (XANAX ) 0.25 MG tablet Take 1 tablet (0.25 mg total) by mouth 2 (two) times daily as needed for anxiety. (Patient not taking: Reported on 06/12/2024) 20 tablet 0   buPROPion  (WELLBUTRIN  XL) 300 MG 24 hr tablet TAKE 1 TABLET BY MOUTH DAILY (Patient not taking: Reported on 06/12/2024) 30 tablet 0   hydrOXYzine  (ATARAX ) 10 MG tablet TAKE 1 TABLET(10 MG) BY MOUTH DAILY AS NEEDED FOR ANXIETY (Patient not taking: Reported on 06/12/2024) 90 tablet 0    levothyroxine  (SYNTHROID ) 125 MCG tablet TAKE 1 TABLET(125 MCG) BY MOUTH DAILY (Patient not taking: Reported on 06/12/2024) 90 tablet 2   medroxyPROGESTERone  (DEPO-PROVERA ) 150 MG/ML injection Inject 1 mL (150 mg total) into the muscle every 3 (three) months. (Patient not taking: Reported on 06/12/2024) 1 mL 4   naproxen  (NAPROSYN ) 500 MG tablet Take 1 tablet (500 mg total) by mouth daily as needed for headache. (Patient not taking: Reported on 06/12/2024) 30 tablet 1   sertraline  (ZOLOFT ) 50 MG tablet TAKE  2 TABLETS BY MOUTH DAILY (Patient not taking: Reported on 06/12/2024) 60 tablet 0   No facility-administered medications prior to visit.    Allergies  Allergen Reactions   Alka-Seltzer Plus Cold [Chlorphen-Phenyleph-Asa] Swelling   Tessalon [Benzonatate] Hives and Swelling   Bactrim  [Sulfamethoxazole -Trimethoprim ] Nausea And Vomiting    ROS Review of Systems  Constitutional:  Positive for fatigue.  Musculoskeletal:  Positive for arthralgias.  Psychiatric/Behavioral:  The patient is nervous/anxious.       Objective:    Physical Exam Constitutional:      Appearance: Normal appearance.  HENT:     Head: Normocephalic and atraumatic.     Mouth/Throat:     Mouth: Mucous membranes are moist.     Pharynx: Oropharynx is clear.  Eyes:     Extraocular Movements: Extraocular movements intact.     Conjunctiva/sclera: Conjunctivae normal.     Pupils: Pupils are equal, round, and reactive to light.  Neck:     Comments: No thyromegaly  Cardiovascular:     Rate and Rhythm: Normal rate and regular rhythm.  Pulmonary:     Effort: Pulmonary effort is normal.     Breath sounds: Normal breath sounds.  Musculoskeletal:     Cervical back: No tenderness.     Right lower leg: No edema.     Left lower leg: No edema.  Lymphadenopathy:     Cervical: No cervical adenopathy.  Skin:    General: Skin is warm and dry.  Neurological:     General: No focal deficit present.     Mental Status: She  is alert. Mental status is at baseline.  Psychiatric:        Mood and Affect: Mood normal.        Behavior: Behavior normal.     BP 120/72 (Cuff Size: Normal)   Pulse 100   Temp 98.1 F (36.7 C) (Oral)   Resp 16   Ht 5' 2 (1.575 m)   Wt 128 lb 4.8 oz (58.2 kg)   SpO2 99%   BMI 23.47 kg/m  Wt Readings from Last 3 Encounters:  06/12/24 128 lb 4.8 oz (58.2 kg)  06/14/23 140 lb 6.4 oz (63.7 kg)  05/24/23 140 lb (63.5 kg)     Health Maintenance Due  Topic Date Due   Hepatitis B Vaccines 19-59 Average Risk (1 of 3 - 19+ 3-dose series) Never done   Colonoscopy  Never done   Pneumococcal Vaccine: 50+ Years (1 of 1 - PCV) Never done   Zoster Vaccines- Shingrix (1 of 2) Never done   Mammogram  10/27/2023   Influenza Vaccine  03/03/2024   COVID-19 Vaccine (1 - 2025-26 season) Never done       Topic Date Due   Hepatitis B Vaccines 19-59 Average Risk (1 of 3 - 19+ 3-dose series) Never done    Lab Results  Component Value Date   TSH 0.29 (L) 09/25/2020   Lab Results  Component Value Date   WBC 12.7 (H) 05/20/2023   HGB 12.1 05/20/2023   HCT 34.9 (L) 05/20/2023   MCV 84.1 05/20/2023   PLT 546 (H) 05/20/2023   Lab Results  Component Value Date   NA 135 05/20/2023   K 3.3 (L) 05/20/2023   CO2 21 (L) 05/20/2023   GLUCOSE 109 (H) 05/20/2023   BUN 12 05/20/2023   CREATININE 1.00 05/20/2023   BILITOT 0.5 05/20/2023   ALKPHOS 116 05/20/2023   AST 15 05/20/2023   ALT 31 05/20/2023   PROT  7.6 05/20/2023   ALBUMIN 3.3 (L) 05/20/2023   CALCIUM 8.9 05/20/2023   ANIONGAP 12 05/20/2023   Lab Results  Component Value Date   CHOL 156 09/25/2020   Lab Results  Component Value Date   HDL 54 09/25/2020   Lab Results  Component Value Date   LDLCALC 77 09/25/2020   Lab Results  Component Value Date   TRIG 156 (H) 09/25/2020   Lab Results  Component Value Date   CHOLHDL 2.9 09/25/2020   No results found for: HGBA1C    Assessment & Plan:   Assessment &  Plan Chronic fatigue and generalized pain evaluation Chronic fatigue and generalized pain with possible multifactorial etiology including thyroid  dysfunction, hormonal imbalances, vitamin deficiencies, and autoimmune conditions. Differential diagnosis includes fibromyalgia, anemia, and vitamin deficiencies. - Ordered labs for anemia, kidney function, liver function, electrolytes, vitamin D , vitamin B12, and vitamin B1. - Ordered ANA and inflammatory markers for autoimmune screening. - Coordinated with endocrinologist for thyroid  and hormone lab results. - Consider full antibody panel if initial labs are abnormal. - Discussed low dose naltrexone for autoimmune alopecia. - Discussed exercise and ergonomic adjustments for fibromyalgia-like symptoms.  Hashimoto's thyroiditis Hashimoto's thyroiditis managed with Armour Thyroid  120 mg. Thyroid  dysfunction could contribute to fatigue and generalized pain. - Continue Armour Thyroid  120 mg. - Coordinate with endocrinologist for thyroid  lab results.  Autoimmune alopecia Managed with minoxidil, which is not effective. Dermatologist prescribed low dose naltrexone, a promising treatment for autoimmune conditions. - Start low dose naltrexone as prescribed by dermatologist.  Depression and anxiety Managed with Zoloft  100 mg. Wellbutrin  may benefit motivation and focus, especially for ADHD-like symptoms. - Restart Wellbutrin  at 150 mg daily for two weeks, then increase to 300 mg daily. - Continue Zoloft  100 mg daily. - Follow-up in six weeks to assess response to Wellbutrin .  Vitamin D  deficiency Potential vitamin D  deficiency contributing to fatigue and generalized pain. - Ordered vitamin D  level as part of lab work.  Screening for malignant neoplasm of breast Behind on mammogram screening. Discussed importance of regular screening for breast cancer. - Referred for mammogram screening.  Screening for malignant neoplasm of colon Behind on  colonoscopy screening. Discussed importance of regular screening for colon cancer. - Referred for colonoscopy screening.  - Antinuclear Antib (ANA) - C-reactive protein - Sed Rate (ESR) - Comprehensive Metabolic Panel (CMET) - CBC w/Diff/Platelet - Vitamin D  (25 hydroxy) - Vitamin B12 - Vitamin B1 - MM 3D SCREENING MAMMOGRAM BILATERAL BREAST; Future - Ambulatory referral to Gastroenterology - Vitamin D  (25 hydroxy) - buPROPion  (WELLBUTRIN  XL) 150 MG 24 hr tablet; Take 2 tablets (300 mg total) by mouth daily.  Dispense: 60 tablet; Refill: 1 - sertraline  (ZOLOFT ) 100 MG tablet; Take 1 tablet (100 mg total) by mouth daily.  Dispense: 90 tablet; Refill: 1   Follow-up: Return in about 6 weeks (around 07/24/2024).    Sharyle Fischer, DO

## 2024-06-14 ENCOUNTER — Ambulatory Visit: Payer: Self-pay | Admitting: Internal Medicine

## 2024-06-14 DIAGNOSIS — E559 Vitamin D deficiency, unspecified: Secondary | ICD-10-CM

## 2024-06-14 MED ORDER — VITAMIN D (ERGOCALCIFEROL) 1.25 MG (50000 UNIT) PO CAPS: 50000.0000 [IU] | ORAL_CAPSULE | ORAL | 0 refills | Status: AC

## 2024-06-15 LAB — COMPREHENSIVE METABOLIC PANEL WITH GFR
AG Ratio: 1.7 (calc) (ref 1.0–2.5)
ALT: 14 U/L (ref 6–29)
AST: 20 U/L (ref 10–35)
Albumin: 4.7 g/dL (ref 3.6–5.1)
Alkaline phosphatase (APISO): 55 U/L (ref 37–153)
BUN: 14 mg/dL (ref 7–25)
CO2: 27 mmol/L (ref 20–32)
Calcium: 10.1 mg/dL (ref 8.6–10.4)
Chloride: 102 mmol/L (ref 98–110)
Creat: 0.75 mg/dL (ref 0.50–1.03)
Globulin: 2.7 g/dL (ref 1.9–3.7)
Glucose, Bld: 63 mg/dL — ABNORMAL LOW (ref 65–99)
Potassium: 4.1 mmol/L (ref 3.5–5.3)
Sodium: 138 mmol/L (ref 135–146)
Total Bilirubin: 0.9 mg/dL (ref 0.2–1.2)
Total Protein: 7.4 g/dL (ref 6.1–8.1)
eGFR: 97 mL/min/1.73m2 (ref >=60)

## 2024-06-15 LAB — CBC WITH DIFFERENTIAL/PLATELET
Absolute Lymphocytes: 1814 {cells}/uL (ref 850–3900)
Absolute Monocytes: 462 {cells}/uL (ref 200–950)
Basophils Absolute: 73 {cells}/uL (ref 0–200)
Basophils Relative: 0.9 %
Eosinophils Absolute: 308 {cells}/uL (ref 15–500)
Eosinophils Relative: 3.8 %
HCT: 41.1 % (ref 35.0–45.0)
Hemoglobin: 13.6 g/dL (ref 11.7–15.5)
MCH: 29.5 pg (ref 27.0–33.0)
MCHC: 33.1 g/dL (ref 32.0–36.0)
MCV: 89.2 fL (ref 80.0–100.0)
MPV: 9.3 fL (ref 7.5–12.5)
Monocytes Relative: 5.7 %
Neutro Abs: 5443 {cells}/uL (ref 1500–7800)
Neutrophils Relative %: 67.2 %
Platelets: 424 Thousand/uL — ABNORMAL HIGH (ref 140–400)
RBC: 4.61 Million/uL (ref 3.80–5.10)
RDW: 12.3 % (ref 11.0–15.0)
Total Lymphocyte: 22.4 %
WBC: 8.1 Thousand/uL (ref 3.8–10.8)

## 2024-06-15 LAB — ANTI-NUCLEAR AB-TITER (ANA TITER)
ANA TITER: 1:80 {titer} — ABNORMAL HIGH
ANA Titer 1: 1:80 {titer} — ABNORMAL HIGH

## 2024-06-15 LAB — VITAMIN B12: Vitamin B-12: 920 pg/mL (ref 200–1100)

## 2024-06-15 LAB — SEDIMENTATION RATE: Sed Rate: 9 mm/h (ref 0–20)

## 2024-06-15 LAB — VITAMIN D 25 HYDROXY (VIT D DEFICIENCY, FRACTURES): Vit D, 25-Hydroxy: 28 ng/mL — ABNORMAL LOW (ref 30–100)

## 2024-06-15 LAB — ANA: Anti Nuclear Antibody (ANA): POSITIVE — AB

## 2024-06-15 LAB — VITAMIN B1: Vitamin B1 (Thiamine): 13 nmol/L (ref 8–30)

## 2024-06-15 LAB — C-REACTIVE PROTEIN: CRP: <3 mg/L (ref <8.0)

## 2024-07-11 ENCOUNTER — Other Ambulatory Visit: Payer: Self-pay | Admitting: Internal Medicine

## 2024-07-11 DIAGNOSIS — G44221 Chronic tension-type headache, intractable: Secondary | ICD-10-CM

## 2024-07-13 NOTE — Telephone Encounter (Signed)
 Requested medication (s) are due for refill today: yes  Requested medication (s) are on the active medication list: yes  Last refill:  06/05/24  Future visit scheduled: {Yes  Notes to clinic:  Unable to refill per protocol, cannot delegate.      Requested Prescriptions  Pending Prescriptions Disp Refills   tiZANidine  (ZANAFLEX ) 4 MG tablet [Pharmacy Med Name: TIZANIDINE  4MG  TABLETS] 30 tablet 1    Sig: TAKE 1 TABLET(4 MG) BY MOUTH AT BEDTIME AS NEEDED FOR MUSCLE SPASMS     Not Delegated - Cardiovascular:  Alpha-2 Agonists - tizanidine  Failed - 07/13/2024  9:27 AM      Failed - This refill cannot be delegated      Passed - Valid encounter within last 6 months    Recent Outpatient Visits           1 month ago Chronic fatigue   [facility] [place], [place]

## 2024-07-25 ENCOUNTER — Other Ambulatory Visit: Payer: Self-pay | Admitting: Medical Genetics

## 2024-08-01 ENCOUNTER — Ambulatory Visit: Admitting: Internal Medicine

## 2024-08-07 ENCOUNTER — Other Ambulatory Visit: Payer: Self-pay | Admitting: Internal Medicine

## 2024-08-07 DIAGNOSIS — F418 Other specified anxiety disorders: Secondary | ICD-10-CM

## 2024-08-08 NOTE — Telephone Encounter (Signed)
 Requested Prescriptions  Pending Prescriptions Disp Refills   buPROPion  (WELLBUTRIN  XL) 150 MG 24 hr tablet [Pharmacy Med Name: BUPROPION  XL 150MG  TABLETS (24 H)] 60 tablet 1    Sig: TAKE 2 TABLETS(300 MG) BY MOUTH DAILY     Psychiatry: Antidepressants - bupropion  Failed - 08/08/2024  1:01 PM      Failed - Completed PHQ-2 or PHQ-9 in the last 360 days      Passed - Cr in normal range and within 360 days    Creat  Date Value Ref Range Status  06/12/2024 0.75 0.50 - 1.03 mg/dL Final         Passed - AST in normal range and within 360 days    AST  Date Value Ref Range Status  06/12/2024 20 10 - 35 U/L Final         Passed - ALT in normal range and within 360 days    ALT  Date Value Ref Range Status  06/12/2024 14 6 - 29 U/L Final         Passed - Last BP in normal range    BP Readings from Last 1 Encounters:  06/12/24 120/72         Passed - Valid encounter within last 6 months    Recent Outpatient Visits           1 month ago Chronic fatigue   [facility] [place], [place]
